# Patient Record
Sex: Female | Born: 1935
Health system: Southern US, Community
[De-identification: ages and names within clinical notes are randomized; demographics above are authoritative.]

## PROBLEM LIST (undated history)

## (undated) DIAGNOSIS — E7800 Pure hypercholesterolemia, unspecified: Secondary | ICD-10-CM

## (undated) DIAGNOSIS — R0600 Dyspnea, unspecified: Secondary | ICD-10-CM

## (undated) DIAGNOSIS — N183 Chronic kidney disease, stage 3 unspecified: Secondary | ICD-10-CM

## (undated) DIAGNOSIS — M1990 Unspecified osteoarthritis, unspecified site: Secondary | ICD-10-CM

## (undated) DIAGNOSIS — I350 Nonrheumatic aortic (valve) stenosis: Secondary | ICD-10-CM

## (undated) DIAGNOSIS — I050 Rheumatic mitral stenosis: Secondary | ICD-10-CM

## (undated) DIAGNOSIS — R7303 Prediabetes: Secondary | ICD-10-CM

## (undated) DIAGNOSIS — C20 Malignant neoplasm of rectum: Secondary | ICD-10-CM

## (undated) HISTORY — PX: BREAST BIOPSY: SHX20

## (undated) HISTORY — PX: COLON SURGERY: SHX602

## (undated) HISTORY — PX: BLADDER SURGERY: SHX569

## (undated) HISTORY — PX: APPENDECTOMY: SHX54

## (undated) HISTORY — PX: CHOLECYSTECTOMY: SHX55

## (undated) HISTORY — DX: Malignant neoplasm of rectum: C20

## (undated) HISTORY — PX: EYE SURGERY: SHX253

## (undated) HISTORY — PX: TONSILLECTOMY: SUR1361

## (undated) HISTORY — DX: Pure hypercholesterolemia, unspecified: E78.00

---

## 2002-12-24 ENCOUNTER — Encounter: Admission: RE | Admit: 2002-12-24 | Discharge: 2002-12-24 | Payer: Self-pay | Admitting: Family Medicine

## 2004-01-07 ENCOUNTER — Encounter: Admission: RE | Admit: 2004-01-07 | Discharge: 2004-01-07 | Payer: Self-pay

## 2005-03-09 ENCOUNTER — Encounter: Admission: RE | Admit: 2005-03-09 | Discharge: 2005-03-09 | Payer: Self-pay

## 2006-03-25 ENCOUNTER — Encounter: Admission: RE | Admit: 2006-03-25 | Discharge: 2006-03-25 | Payer: Self-pay

## 2007-05-09 ENCOUNTER — Encounter: Admission: RE | Admit: 2007-05-09 | Discharge: 2007-05-09 | Payer: Self-pay

## 2010-09-26 DIAGNOSIS — D72819 Decreased white blood cell count, unspecified: Secondary | ICD-10-CM

## 2010-09-26 DIAGNOSIS — N189 Chronic kidney disease, unspecified: Secondary | ICD-10-CM

## 2015-04-08 DIAGNOSIS — E7800 Pure hypercholesterolemia, unspecified: Secondary | ICD-10-CM

## 2015-04-08 HISTORY — DX: Pure hypercholesterolemia, unspecified: E78.00

## 2015-07-23 DIAGNOSIS — M189 Osteoarthritis of first carpometacarpal joint, unspecified: Secondary | ICD-10-CM | POA: Diagnosis not present

## 2015-07-23 DIAGNOSIS — G5602 Carpal tunnel syndrome, left upper limb: Secondary | ICD-10-CM | POA: Diagnosis not present

## 2015-07-23 DIAGNOSIS — M6530 Trigger finger, unspecified finger: Secondary | ICD-10-CM | POA: Diagnosis not present

## 2015-07-30 DIAGNOSIS — G5602 Carpal tunnel syndrome, left upper limb: Secondary | ICD-10-CM | POA: Diagnosis not present

## 2015-07-30 DIAGNOSIS — I129 Hypertensive chronic kidney disease with stage 1 through stage 4 chronic kidney disease, or unspecified chronic kidney disease: Secondary | ICD-10-CM | POA: Diagnosis not present

## 2015-07-30 DIAGNOSIS — N183 Chronic kidney disease, stage 3 (moderate): Secondary | ICD-10-CM | POA: Diagnosis not present

## 2015-07-30 DIAGNOSIS — Z01818 Encounter for other preprocedural examination: Secondary | ICD-10-CM | POA: Diagnosis not present

## 2015-07-30 DIAGNOSIS — K210 Gastro-esophageal reflux disease with esophagitis: Secondary | ICD-10-CM | POA: Diagnosis not present

## 2015-07-30 DIAGNOSIS — J45991 Cough variant asthma: Secondary | ICD-10-CM | POA: Diagnosis not present

## 2015-08-04 DIAGNOSIS — R000 Tachycardia, unspecified: Secondary | ICD-10-CM | POA: Diagnosis not present

## 2015-08-04 DIAGNOSIS — I129 Hypertensive chronic kidney disease with stage 1 through stage 4 chronic kidney disease, or unspecified chronic kidney disease: Secondary | ICD-10-CM | POA: Diagnosis not present

## 2015-08-04 DIAGNOSIS — I951 Orthostatic hypotension: Secondary | ICD-10-CM | POA: Diagnosis not present

## 2015-08-04 DIAGNOSIS — Z79899 Other long term (current) drug therapy: Secondary | ICD-10-CM | POA: Diagnosis not present

## 2015-08-04 DIAGNOSIS — N183 Chronic kidney disease, stage 3 (moderate): Secondary | ICD-10-CM | POA: Diagnosis not present

## 2015-08-08 DIAGNOSIS — N189 Chronic kidney disease, unspecified: Secondary | ICD-10-CM | POA: Diagnosis not present

## 2015-08-08 DIAGNOSIS — Z79899 Other long term (current) drug therapy: Secondary | ICD-10-CM | POA: Diagnosis not present

## 2015-08-08 DIAGNOSIS — M65312 Trigger thumb, left thumb: Secondary | ICD-10-CM | POA: Diagnosis not present

## 2015-08-08 DIAGNOSIS — G5602 Carpal tunnel syndrome, left upper limb: Secondary | ICD-10-CM | POA: Diagnosis not present

## 2015-08-08 DIAGNOSIS — I129 Hypertensive chronic kidney disease with stage 1 through stage 4 chronic kidney disease, or unspecified chronic kidney disease: Secondary | ICD-10-CM | POA: Diagnosis not present

## 2015-08-08 DIAGNOSIS — E7800 Pure hypercholesterolemia, unspecified: Secondary | ICD-10-CM | POA: Diagnosis not present

## 2015-08-08 DIAGNOSIS — K219 Gastro-esophageal reflux disease without esophagitis: Secondary | ICD-10-CM | POA: Diagnosis not present

## 2015-09-29 DIAGNOSIS — I129 Hypertensive chronic kidney disease with stage 1 through stage 4 chronic kidney disease, or unspecified chronic kidney disease: Secondary | ICD-10-CM | POA: Diagnosis not present

## 2015-09-29 DIAGNOSIS — E669 Obesity, unspecified: Secondary | ICD-10-CM | POA: Diagnosis not present

## 2015-09-29 DIAGNOSIS — Z79899 Other long term (current) drug therapy: Secondary | ICD-10-CM | POA: Diagnosis not present

## 2015-09-29 DIAGNOSIS — Z1389 Encounter for screening for other disorder: Secondary | ICD-10-CM | POA: Diagnosis not present

## 2015-09-29 DIAGNOSIS — R7303 Prediabetes: Secondary | ICD-10-CM | POA: Diagnosis not present

## 2015-09-29 DIAGNOSIS — E785 Hyperlipidemia, unspecified: Secondary | ICD-10-CM | POA: Diagnosis not present

## 2015-09-29 DIAGNOSIS — E559 Vitamin D deficiency, unspecified: Secondary | ICD-10-CM | POA: Diagnosis not present

## 2015-09-29 DIAGNOSIS — Z9181 History of falling: Secondary | ICD-10-CM | POA: Diagnosis not present

## 2015-10-13 DIAGNOSIS — Z136 Encounter for screening for cardiovascular disorders: Secondary | ICD-10-CM | POA: Diagnosis not present

## 2015-10-13 DIAGNOSIS — Z6831 Body mass index (BMI) 31.0-31.9, adult: Secondary | ICD-10-CM | POA: Diagnosis not present

## 2015-10-13 DIAGNOSIS — R300 Dysuria: Secondary | ICD-10-CM | POA: Diagnosis not present

## 2016-07-28 DIAGNOSIS — N390 Urinary tract infection, site not specified: Secondary | ICD-10-CM | POA: Diagnosis not present

## 2016-07-28 DIAGNOSIS — M79661 Pain in right lower leg: Secondary | ICD-10-CM | POA: Diagnosis not present

## 2016-07-28 DIAGNOSIS — M7989 Other specified soft tissue disorders: Secondary | ICD-10-CM | POA: Diagnosis not present

## 2016-08-18 DIAGNOSIS — K5730 Diverticulosis of large intestine without perforation or abscess without bleeding: Secondary | ICD-10-CM | POA: Diagnosis not present

## 2016-08-18 DIAGNOSIS — K921 Melena: Secondary | ICD-10-CM | POA: Diagnosis not present

## 2016-08-25 DIAGNOSIS — S86111D Strain of other muscle(s) and tendon(s) of posterior muscle group at lower leg level, right leg, subsequent encounter: Secondary | ICD-10-CM | POA: Diagnosis not present

## 2016-09-08 DIAGNOSIS — K5730 Diverticulosis of large intestine without perforation or abscess without bleeding: Secondary | ICD-10-CM | POA: Diagnosis not present

## 2016-09-22 DIAGNOSIS — N390 Urinary tract infection, site not specified: Secondary | ICD-10-CM | POA: Diagnosis not present

## 2016-09-22 DIAGNOSIS — R300 Dysuria: Secondary | ICD-10-CM | POA: Diagnosis not present

## 2016-10-13 DIAGNOSIS — Z79899 Other long term (current) drug therapy: Secondary | ICD-10-CM | POA: Diagnosis not present

## 2016-10-13 DIAGNOSIS — E785 Hyperlipidemia, unspecified: Secondary | ICD-10-CM | POA: Diagnosis not present

## 2016-10-13 DIAGNOSIS — K210 Gastro-esophageal reflux disease with esophagitis: Secondary | ICD-10-CM | POA: Diagnosis not present

## 2016-10-13 DIAGNOSIS — R7303 Prediabetes: Secondary | ICD-10-CM | POA: Diagnosis not present

## 2016-10-13 DIAGNOSIS — E559 Vitamin D deficiency, unspecified: Secondary | ICD-10-CM | POA: Diagnosis not present

## 2016-10-13 DIAGNOSIS — Z9181 History of falling: Secondary | ICD-10-CM | POA: Diagnosis not present

## 2016-10-13 DIAGNOSIS — Z1389 Encounter for screening for other disorder: Secondary | ICD-10-CM | POA: Diagnosis not present

## 2016-10-13 DIAGNOSIS — I129 Hypertensive chronic kidney disease with stage 1 through stage 4 chronic kidney disease, or unspecified chronic kidney disease: Secondary | ICD-10-CM | POA: Diagnosis not present

## 2016-10-13 DIAGNOSIS — Z0000 Encounter for general adult medical examination without abnormal findings: Secondary | ICD-10-CM | POA: Diagnosis not present

## 2016-10-15 DIAGNOSIS — R0602 Shortness of breath: Secondary | ICD-10-CM | POA: Diagnosis not present

## 2016-10-15 DIAGNOSIS — R531 Weakness: Secondary | ICD-10-CM | POA: Diagnosis not present

## 2016-10-15 DIAGNOSIS — R42 Dizziness and giddiness: Secondary | ICD-10-CM | POA: Diagnosis not present

## 2016-10-15 DIAGNOSIS — N289 Disorder of kidney and ureter, unspecified: Secondary | ICD-10-CM | POA: Diagnosis not present

## 2016-10-15 DIAGNOSIS — I10 Essential (primary) hypertension: Secondary | ICD-10-CM | POA: Diagnosis not present

## 2016-10-20 DIAGNOSIS — E785 Hyperlipidemia, unspecified: Secondary | ICD-10-CM | POA: Diagnosis not present

## 2016-10-20 DIAGNOSIS — N183 Chronic kidney disease, stage 3 (moderate): Secondary | ICD-10-CM | POA: Diagnosis not present

## 2016-10-20 DIAGNOSIS — R7303 Prediabetes: Secondary | ICD-10-CM | POA: Diagnosis not present

## 2016-10-20 DIAGNOSIS — I129 Hypertensive chronic kidney disease with stage 1 through stage 4 chronic kidney disease, or unspecified chronic kidney disease: Secondary | ICD-10-CM | POA: Diagnosis not present

## 2016-10-20 DIAGNOSIS — E559 Vitamin D deficiency, unspecified: Secondary | ICD-10-CM | POA: Diagnosis not present

## 2016-11-04 DIAGNOSIS — N390 Urinary tract infection, site not specified: Secondary | ICD-10-CM | POA: Diagnosis not present

## 2016-11-15 DIAGNOSIS — N390 Urinary tract infection, site not specified: Secondary | ICD-10-CM | POA: Diagnosis not present

## 2016-11-15 DIAGNOSIS — R300 Dysuria: Secondary | ICD-10-CM | POA: Diagnosis not present

## 2016-11-15 DIAGNOSIS — B9620 Unspecified Escherichia coli [E. coli] as the cause of diseases classified elsewhere: Secondary | ICD-10-CM | POA: Diagnosis not present

## 2016-11-22 DIAGNOSIS — B9620 Unspecified Escherichia coli [E. coli] as the cause of diseases classified elsewhere: Secondary | ICD-10-CM | POA: Diagnosis not present

## 2016-11-22 DIAGNOSIS — N390 Urinary tract infection, site not specified: Secondary | ICD-10-CM | POA: Diagnosis not present

## 2016-11-22 DIAGNOSIS — R05 Cough: Secondary | ICD-10-CM | POA: Diagnosis not present

## 2016-11-22 DIAGNOSIS — J22 Unspecified acute lower respiratory infection: Secondary | ICD-10-CM | POA: Diagnosis not present

## 2016-11-26 DIAGNOSIS — N390 Urinary tract infection, site not specified: Secondary | ICD-10-CM | POA: Diagnosis not present

## 2016-11-26 DIAGNOSIS — B9620 Unspecified Escherichia coli [E. coli] as the cause of diseases classified elsewhere: Secondary | ICD-10-CM | POA: Diagnosis not present

## 2016-11-26 DIAGNOSIS — R300 Dysuria: Secondary | ICD-10-CM | POA: Diagnosis not present

## 2016-11-26 DIAGNOSIS — J301 Allergic rhinitis due to pollen: Secondary | ICD-10-CM | POA: Diagnosis not present

## 2016-12-22 DIAGNOSIS — N3090 Cystitis, unspecified without hematuria: Secondary | ICD-10-CM | POA: Diagnosis not present

## 2016-12-22 DIAGNOSIS — R339 Retention of urine, unspecified: Secondary | ICD-10-CM | POA: Diagnosis not present

## 2017-01-03 DIAGNOSIS — K5730 Diverticulosis of large intestine without perforation or abscess without bleeding: Secondary | ICD-10-CM | POA: Diagnosis not present

## 2017-01-17 DIAGNOSIS — N390 Urinary tract infection, site not specified: Secondary | ICD-10-CM | POA: Diagnosis not present

## 2017-01-28 DIAGNOSIS — C20 Malignant neoplasm of rectum: Secondary | ICD-10-CM | POA: Diagnosis not present

## 2017-01-28 DIAGNOSIS — K5730 Diverticulosis of large intestine without perforation or abscess without bleeding: Secondary | ICD-10-CM | POA: Diagnosis not present

## 2017-01-28 DIAGNOSIS — Z79899 Other long term (current) drug therapy: Secondary | ICD-10-CM | POA: Diagnosis not present

## 2017-01-28 DIAGNOSIS — Z86010 Personal history of colonic polyps: Secondary | ICD-10-CM | POA: Diagnosis not present

## 2017-01-28 DIAGNOSIS — D126 Benign neoplasm of colon, unspecified: Secondary | ICD-10-CM | POA: Diagnosis not present

## 2017-01-28 DIAGNOSIS — N189 Chronic kidney disease, unspecified: Secondary | ICD-10-CM | POA: Diagnosis not present

## 2017-01-28 DIAGNOSIS — Z9049 Acquired absence of other specified parts of digestive tract: Secondary | ICD-10-CM | POA: Diagnosis not present

## 2017-01-28 DIAGNOSIS — J45909 Unspecified asthma, uncomplicated: Secondary | ICD-10-CM | POA: Diagnosis not present

## 2017-01-28 DIAGNOSIS — I129 Hypertensive chronic kidney disease with stage 1 through stage 4 chronic kidney disease, or unspecified chronic kidney disease: Secondary | ICD-10-CM | POA: Diagnosis not present

## 2017-01-28 DIAGNOSIS — K644 Residual hemorrhoidal skin tags: Secondary | ICD-10-CM | POA: Diagnosis not present

## 2017-02-03 DIAGNOSIS — N189 Chronic kidney disease, unspecified: Secondary | ICD-10-CM | POA: Diagnosis not present

## 2017-02-03 DIAGNOSIS — I10 Essential (primary) hypertension: Secondary | ICD-10-CM | POA: Diagnosis not present

## 2017-02-03 DIAGNOSIS — C20 Malignant neoplasm of rectum: Secondary | ICD-10-CM | POA: Diagnosis not present

## 2017-02-03 DIAGNOSIS — M1990 Unspecified osteoarthritis, unspecified site: Secondary | ICD-10-CM | POA: Diagnosis not present

## 2017-02-08 DIAGNOSIS — Z86010 Personal history of colonic polyps: Secondary | ICD-10-CM | POA: Diagnosis not present

## 2017-02-08 DIAGNOSIS — R911 Solitary pulmonary nodule: Secondary | ICD-10-CM | POA: Diagnosis not present

## 2017-02-08 DIAGNOSIS — C20 Malignant neoplasm of rectum: Secondary | ICD-10-CM | POA: Diagnosis not present

## 2017-02-08 DIAGNOSIS — N390 Urinary tract infection, site not specified: Secondary | ICD-10-CM | POA: Diagnosis not present

## 2017-02-10 DIAGNOSIS — R911 Solitary pulmonary nodule: Secondary | ICD-10-CM | POA: Diagnosis not present

## 2017-02-10 DIAGNOSIS — D3501 Benign neoplasm of right adrenal gland: Secondary | ICD-10-CM | POA: Diagnosis not present

## 2017-02-10 DIAGNOSIS — K449 Diaphragmatic hernia without obstruction or gangrene: Secondary | ICD-10-CM | POA: Diagnosis not present

## 2017-02-10 DIAGNOSIS — I2510 Atherosclerotic heart disease of native coronary artery without angina pectoris: Secondary | ICD-10-CM | POA: Diagnosis not present

## 2017-02-10 DIAGNOSIS — I700 Atherosclerosis of aorta: Secondary | ICD-10-CM | POA: Diagnosis not present

## 2017-02-10 DIAGNOSIS — C189 Malignant neoplasm of colon, unspecified: Secondary | ICD-10-CM | POA: Diagnosis not present

## 2017-02-10 DIAGNOSIS — C218 Malignant neoplasm of overlapping sites of rectum, anus and anal canal: Secondary | ICD-10-CM | POA: Diagnosis not present

## 2017-02-17 ENCOUNTER — Other Ambulatory Visit: Payer: Self-pay

## 2017-02-17 ENCOUNTER — Telehealth: Payer: Self-pay

## 2017-02-17 DIAGNOSIS — C20 Malignant neoplasm of rectum: Secondary | ICD-10-CM

## 2017-02-17 NOTE — Telephone Encounter (Signed)
EUS scheduled for 02/24/17

## 2017-02-18 NOTE — Telephone Encounter (Signed)
EUS scheduled, pt instructed and medications reviewed.  Patient instructions mailed to home.  Patient to call with any questions or concerns.  

## 2017-02-24 ENCOUNTER — Telehealth: Payer: Self-pay

## 2017-02-24 ENCOUNTER — Encounter (HOSPITAL_COMMUNITY)
Admission: RE | Disposition: A | Discharge status: Home / Self Care | Payer: Self-pay | Source: Ambulatory Visit | Attending: Gastroenterology

## 2017-02-24 ENCOUNTER — Ambulatory Visit (HOSPITAL_COMMUNITY)
Admission: RE | Admit: 2017-02-24 | Discharge: 2017-02-24 | Disposition: A | Discharge status: Home / Self Care | Payer: Medicare Other | Source: Ambulatory Visit | Attending: Gastroenterology | Admitting: Gastroenterology

## 2017-02-24 ENCOUNTER — Encounter (HOSPITAL_COMMUNITY): Payer: Self-pay | Admitting: Emergency Medicine

## 2017-02-24 DIAGNOSIS — K6289 Other specified diseases of anus and rectum: Secondary | ICD-10-CM | POA: Diagnosis not present

## 2017-02-24 DIAGNOSIS — C218 Malignant neoplasm of overlapping sites of rectum, anus and anal canal: Secondary | ICD-10-CM

## 2017-02-24 DIAGNOSIS — C20 Malignant neoplasm of rectum: Secondary | ICD-10-CM | POA: Diagnosis not present

## 2017-02-24 HISTORY — PX: EUS: SHX5427

## 2017-02-24 SURGERY — ULTRASOUND, LOWER GI TRACT, ENDOSCOPIC
Anesthesia: Moderate Sedation

## 2017-02-24 MED ORDER — FENTANYL CITRATE (PF) 100 MCG/2ML IJ SOLN
INTRAMUSCULAR | Status: DC | PRN
  Administered 2017-02-24 (×3): 12.5 ug via INTRAVENOUS

## 2017-02-24 MED ORDER — SODIUM CHLORIDE 0.9 % IV BOLUS (SEPSIS)
500.0000 mL | Freq: Once | INTRAVENOUS | Status: AC
  Administered 2017-02-24: 500 mL via INTRAVENOUS

## 2017-02-24 MED ORDER — MIDAZOLAM HCL 10 MG/2ML IJ SOLN
INTRAMUSCULAR | Status: DC | PRN
  Administered 2017-02-24: 2 mg via INTRAVENOUS
  Administered 2017-02-24 (×2): 1 mg via INTRAVENOUS

## 2017-02-24 MED ORDER — SODIUM CHLORIDE 0.9 % IV SOLN: INTRAVENOUS | Status: DC

## 2017-02-24 MED ORDER — FENTANYL CITRATE (PF) 100 MCG/2ML IJ SOLN
INTRAMUSCULAR | Status: AC
  Filled 2017-02-24: qty 2

## 2017-02-24 MED ORDER — MIDAZOLAM HCL 5 MG/ML IJ SOLN
INTRAMUSCULAR | Status: AC
  Filled 2017-02-24: qty 2

## 2017-02-24 NOTE — Telephone Encounter (Signed)
Report faxed as requested

## 2017-02-24 NOTE — H&P (Signed)
HPI: This is an 83 yo woman Referred by Dr. Hinton Rao from [facility]  Chief complaint is newly diagnosed rectal adenocarcinoma without evidence of metastatic disease on recent CT scan ches, abd, pelvis  ROS: complete GI ROS as described in HPI, all other review negative.  Constitutional:  No unintentional weight loss   History reviewed. No pertinent past medical history.  History reviewed. No pertinent surgical history.  Current Facility-Administered Medications  Medication Dose Route Frequency Provider Last Rate Last Dose  . 0.9 %  sodium chloride infusion   Intravenous Continuous Milus Banister, MD        Allergies as of 02/17/2017  . (Not on File)    History reviewed. No pertinent family history.  Social History   Social History  . Marital status: Widowed    Spouse name: N/A  . Number of children: N/A  . Years of education: N/A   Occupational History  . Not on file.   Social History Main Topics  . Smoking status: Not on file  . Smokeless tobacco: Not on file  . Alcohol use Not on file  . Drug use: Unknown  . Sexual activity: Not on file   Other Topics Concern  . Not on file   Social History Narrative  . No narrative on file     Physical Exam: BP (!) 177/62   Pulse 91   Temp (!) 97.2 F (36.2 C) (Oral)   Resp 17   Ht 4\' 11"  (1.499 m)   Wt 150 lb (68 kg)   SpO2 97%   BMI 30.30 kg/m  Constitutional: generally well-appearing Psychiatric: alert and oriented x3 Abdomen: soft, nontender, nondistended, no obvious ascites, no peritoneal signs, normal bowel sounds No peripheral edema noted in lower extremities  Assessment and plan: 83 y.o. female with recently diagnosed rectal adenocarcinoma (Colonoscoy Dr. Lyda Jester)  Her for TN staging with EUS  Please see the "Patient Instructions" section for addition details about the plan.  Owens Loffler, MD [facility] 02/24/2017, 2:50 PM

## 2017-02-24 NOTE — Discharge Instructions (Signed)

## 2017-02-24 NOTE — Op Note (Signed)
[facility] Patient Name: Hannah Briggs Procedure Date: 02/24/2017 MRN: [id number] Attending MD: Milus Banister , MD Date of Birth: 01/30/1932 CSN: [id number] Age: 83 Admit Type: Outpatient Procedure:                Lower EUS Indications:              Recently diagnosed rectal adenocarcinoma                            (colonoscopy Dr. Lyda Jester) without evident                            metastatic disease on chest/abd/pelvis CT scans. Providers:                Milus Banister, MD, Zenon Mayo, RN, Cletis Athens, Technician Referring MD:              Medicines:                Fentanyl 30 micrograms IV, Midazolam 4 mg IV Complications:            No immediate complications. Estimated blood loss:                            None. Estimated Blood Loss:     Estimated blood loss: none. Procedure:                Pre-Anesthesia Assessment:                           - Prior to the procedure, a History and Physical                            was performed, and patient medications and                            allergies were reviewed. The patient's tolerance of                            previous anesthesia was also reviewed. The risks                            and benefits of the procedure and the sedation                            options and risks were discussed with the patient.                            All questions were answered, and informed consent                            was obtained. Prior Anticoagulants: The patient has  taken no previous anticoagulant or antiplatelet                            agents. ASA Grade Assessment: II - A patient with                            mild systemic disease. After reviewing the risks                            and benefits, the patient was deemed in                            satisfactory condition to undergo the procedure.                           After obtaining informed  consent, the endoscope was                            passed under direct vision. Throughout the                            procedure, the patient's blood pressure, pulse, and                            oxygen saturations were monitored continuously. The                            [id number] ([id number]) scope was introduced through                            the anus and advanced to the the sigmoid colon for                            ultrasound. The lower EUS was accomplished without                            difficulty. The patient tolerated the procedure                            well. The quality of the bowel preparation was good. Scope In: Scope Out: Findings:      Sigmoidoscopic findings:      1. Clearly malignant 2.5cm, non-circumferential mass that lays on the       left side of the mid rectum with distal edge located 7-8cm from the anal       verge.      Endosonographic Finding :      1. The mass above correlated with a hypoechoic, heterogeneous lesion       that appears to involve and expand the muscularis proprial layer but       does not invade through that layer. uT2      2. No perirectal adenopathy. uN0 Impression:               - Non-circumferential 2.5cm uT2N0 (Stage I) rectal  adenocarcinoma that lays on the left lateral wall                            of the mid rectum with distal edge located 7-8cm                            from the anal verge. Moderate Sedation:      Moderate (conscious) sedation was administered by the endoscopy nurse       and supervised by the endoscopist. The following parameters were       monitored: oxygen saturation, heart rate, blood pressure, and response       to care. Total physician intraservice time was 19 minutes. Recommendation:           - Discharge patient to home (ambulatory).                           - Follow up with surgery, oncology in [place]. Procedure Code(s):        --- Professional ---                            [phone], Sigmoidoscopy, flexible; with endoscopic                            ultrasound examination                           99152, Moderate sedation services provided by the                            same physician or other qualified health care                            professional performing the diagnostic or                            therapeutic service that the sedation supports,                            requiring the presence of an independent trained                            observer to assist in the monitoring of the                            patient's level of consciousness and physiological                            status; initial 15 minutes of intraservice time,                            patient age 44 years or older Diagnosis Code(s):        --- Professional ---                           K62.89,  Other specified diseases of anus and rectum                           C21.8, Malignant neoplasm of overlapping sites of                            rectum, anus and anal canal CPT copyright 2016 American Medical Association. All rights reserved. The codes documented in this report are preliminary and upon coder review may  be revised to meet current compliance requirements. Milus Banister, MD 02/24/2017 3:38:35 PM This report has been signed electronically. Number of Addenda: 0

## 2017-02-24 NOTE — Telephone Encounter (Signed)
-----   Message from Milus Banister, MD sent at 02/24/2017  3:39 PM EDT ----- Can you send copies of todays EUS to Dr. Hinton Rao [facility]), Dr. Carmell Austria (surgeon in [place]), Dr. Lyda Jester (GI in [place]).   Thanks

## 2017-02-27 ENCOUNTER — Encounter (HOSPITAL_COMMUNITY): Payer: Self-pay | Admitting: Gastroenterology

## 2017-03-08 DIAGNOSIS — K219 Gastro-esophageal reflux disease without esophagitis: Secondary | ICD-10-CM | POA: Insufficient documentation

## 2017-03-08 DIAGNOSIS — N189 Chronic kidney disease, unspecified: Secondary | ICD-10-CM | POA: Insufficient documentation

## 2017-03-08 DIAGNOSIS — E785 Hyperlipidemia, unspecified: Secondary | ICD-10-CM

## 2017-03-08 DIAGNOSIS — E559 Vitamin D deficiency, unspecified: Secondary | ICD-10-CM | POA: Insufficient documentation

## 2017-03-08 DIAGNOSIS — R011 Cardiac murmur, unspecified: Secondary | ICD-10-CM | POA: Insufficient documentation

## 2017-03-08 DIAGNOSIS — C20 Malignant neoplasm of rectum: Secondary | ICD-10-CM | POA: Diagnosis not present

## 2017-03-08 DIAGNOSIS — I10 Essential (primary) hypertension: Secondary | ICD-10-CM | POA: Diagnosis not present

## 2017-03-08 DIAGNOSIS — M1990 Unspecified osteoarthritis, unspecified site: Secondary | ICD-10-CM | POA: Diagnosis not present

## 2017-03-08 DIAGNOSIS — I119 Hypertensive heart disease without heart failure: Secondary | ICD-10-CM

## 2017-03-08 DIAGNOSIS — R7303 Prediabetes: Secondary | ICD-10-CM

## 2017-03-08 HISTORY — DX: Prediabetes: R73.03

## 2017-03-08 HISTORY — DX: Hyperlipidemia, unspecified: E78.5

## 2017-03-08 HISTORY — DX: Vitamin D deficiency, unspecified: E55.9

## 2017-03-08 HISTORY — DX: Gastro-esophageal reflux disease without esophagitis: K21.9

## 2017-03-08 HISTORY — DX: Hypertensive heart disease without heart failure: I11.9

## 2017-03-09 DIAGNOSIS — I350 Nonrheumatic aortic (valve) stenosis: Secondary | ICD-10-CM | POA: Insufficient documentation

## 2017-03-10 ENCOUNTER — Encounter: Payer: Self-pay | Admitting: Cardiology

## 2017-03-10 ENCOUNTER — Ambulatory Visit (INDEPENDENT_AMBULATORY_CARE_PROVIDER_SITE_OTHER): Payer: Medicare Other | Admitting: Cardiology

## 2017-03-10 VITALS — BP 144/70 | HR 81 | Ht 59.0 in | Wt 155.0 lb

## 2017-03-10 DIAGNOSIS — I350 Nonrheumatic aortic (valve) stenosis: Secondary | ICD-10-CM | POA: Diagnosis not present

## 2017-03-10 DIAGNOSIS — I10 Essential (primary) hypertension: Secondary | ICD-10-CM | POA: Diagnosis not present

## 2017-03-10 DIAGNOSIS — Z01810 Encounter for preprocedural cardiovascular examination: Secondary | ICD-10-CM | POA: Diagnosis not present

## 2017-03-10 NOTE — Progress Notes (Signed)
Cardiology Office Note:    Date:  03/10/2017   ID:  Hannah Briggs, DOB 1932-05-30, MRN [id number]  PCP:  Melony Overly, MD  Cardiologist:  Shirlee More, MD    Referring MD: Melony Overly, MD    ASSESSMENT:    1. Preoperative cardiovascular examination   2. Nonrheumatic aortic valve stenosis   3. Essential hypertension    PLAN:    Preoperative cardiovascular evaluation  Surgeon: Dr Dory Larsen Procedure: Low resection for colorectal cancer The surgery is urgent. Active cardiac problems  Aortic stenosis. The cardiac status is stable, asymptomatic. The planned procedure is intermediate risk. The cardiac risk factors are hypertension and aortic stenosis The functional capacity is 4 mets or greater,   Yes, SHE DOES HEAVY HOUSE AND GARDEN WORK Recent cardiac tests performed EKG today is stable Given the above his overall risk for the planned procedure is acceptable if her Echo tomorrow 8AM is stable Antiplatelet/ anticoagulant recommendation: stop aspirin  Other cardiac medication or device recommendation: continue usual BP meds Anesthesia recommendation: Avoid hypotension with moderate AS on exam Observation, monitoring,and postoperative test recommendation: Telemtry for 24 hrs after surgery, EKG PO day 1  In order of problems listed above:  1. I feel that she is a good candidate for planned surgical procedure except we need to check an echocardiogram to look at the severity of her aortic stenosis and help guide anesthesia and perioperative care. He is scheduled for 8 AM tomorrow morning I'll call Dr. Pauletta Browns told the patient that by physical examination I expect her to proceed with surgery Monday. 2. For echocardiogram 3. Stable hypertension continue current treatment perioperatively along with lipid lowering medications   Next appointment: 6 months   Medication Adjustments/Labs and Tests Ordered: Current medicines are reviewed at length with the patient today.  Concerns  regarding medicines are outlined above.  Orders Placed This Encounter  Procedures  . EKG 12-Lead  . ECHOCARDIOGRAM COMPLETE   No orders of the defined types were placed in this encounter.   Chief Complaint  Patient presents with  . Pre-op Exam    per Dr Pauletta Browns prior to transanal resection  . Pre-op Exam  . Aortic Stenosis    History of Present Illness:    Hannah Briggs is a 83 y.o. female with a hx of hypertension, hyperlipidemia and aortic stenosis who is pending surgery on Monday last seen in Septmeber 2016. At that time I was concerned re severity of AS and echo was done at [facility] and result is requested..She remains active > 4 Mets and has had no chest pain, SOB, palpitation or syncope. Compliance with diet, lifestyle and medications: yes Past Medical History:  Diagnosis Date  . Pure hypercholesterolemia 04/08/2015   Overview:  Intolerant of pravastatin and simvastatin  . Rectal cancer [facility])     Past Surgical History:  Procedure Laterality Date  . APPENDECTOMY    . BLADDER SURGERY    . BREAST BIOPSY    . CESAREAN SECTION    . CHOLECYSTECTOMY    . EUS N/A 02/24/2017   Procedure: LOWER ENDOSCOPIC ULTRASOUND (EUS);  Surgeon: Milus Banister, MD;  Location: [facility];  Service: Endoscopy;  Laterality: N/A;  . TONSILLECTOMY      Current Medications: Current Meds  Medication Sig  . amLODipine (NORVASC) 5 MG tablet Take 5 mg by mouth daily.  . benazepril (LOTENSIN) 40 MG tablet Take 40 mg by mouth daily.  . bethanechol (URECHOLINE) 10 MG tablet Take 10 mg  by mouth 2 (two) times daily.   . Calcium Carbonate-Vitamin D (CALCIUM 600+D PO) Take 1 tablet by mouth daily.  . cephALEXin (KEFLEX) 250 MG capsule Take 250 mg by mouth daily.  . cholecalciferol (VITAMIN D) 1000 units tablet Take 1,000 Units by mouth daily.  Marland Kitchen CINNAMON PO Take 1,000 mg by mouth at bedtime.  . Coenzyme Q10 (COQ10) 100 MG CAPS Take 100 mg by mouth at bedtime.  Marland Kitchen ezetimibe (ZETIA) 10 MG tablet  Take 10 mg by mouth every evening.  . famotidine (PEPCID) 10 MG tablet Take 10 mg by mouth daily as needed for heartburn or indigestion.  . hydrochlorothiazide (HYDRODIURIL) 12.5 MG tablet Take 12.5 mg by mouth daily.  . montelukast (SINGULAIR) 10 MG tablet Take 10 mg by mouth at bedtime.  . Multiple Vitamin (MULTIVITAMIN WITH MINERALS) TABS tablet Take 1 tablet by mouth daily.  . Phenazopyridine HCl (AZO URINARY PAIN RELIEF PO) Take 3 tablets by mouth 3 (three) times daily as needed (uti symptoms).  . Potassium 99 MG TABS Take 99 mg by mouth daily.  . pravastatin (PRAVACHOL) 20 MG tablet Take 20 mg by mouth every Monday, Wednesday, and Friday. At night  . Probiotic CAPS Take 1 capsule by mouth daily.  Marland Kitchen PROCTO-MED HC 2.5 % rectal cream Place 1 application rectally daily as needed for hemorrhoids.  . [DISCONTINUED] aspirin EC 81 MG tablet Take 81 mg by mouth daily.     Allergies:   Claritin-d 12 hour [loratadine-pseudoephedrine er]; Codeine; Lisinopril; and Statins   Social History   Social History  . Marital status: Widowed    Spouse name: N/A  . Number of children: N/A  . Years of education: N/A   Social History Main Topics  . Smoking status: Never Smoker  . Smokeless tobacco: Never Used  . Alcohol use No  . Drug use: No  . Sexual activity: Not Asked   Other Topics Concern  . None   Social History Narrative  . None     Family History: The patient's family history includes AAA (abdominal aortic aneurysm) in her brother; Congestive Heart Failure in her father; Heart attack in her mother; Heart disease in her sister; Parkinson's disease in her sister. ROS:   Please see the history of present illness.    All other systems reviewed and are negative.  EKGs/Labs/Other Studies Reviewed:    The following studies were reviewed today:  EKG:  EKG ordered today.  The ekg ordered today demonstrates [place] poor R wave progression  Recent Labs:CMP 03/08/17 normal No results found  for requested labs within last 8760 hours.  Recent Lipid Panel No results found for: CHOL, TRIG, HDL, CHOLHDL, VLDL, LDLCALC, LDLDIRECT  Physical Exam:    VS:  BP (!) 144/70 (BP Location: Right Arm, Patient Position: Sitting)   Pulse 81   Ht 4\' 11"  (1.499 m)   Wt 155 lb (70.3 kg)   SpO2 95%   BMI 31.31 kg/m     Wt Readings from Last 3 Encounters:  03/10/17 155 lb (70.3 kg)  02/24/17 150 lb (68 kg)     GEN:  Well nourished, well developed in no acute distress HEENT: Normal NECK: No JVD; No carotid bruits LYMPHATICS: No lymphadenopathy CARDIAC: RRR, S2 split, 3/6 SEM mid peak radiates to the base of her carotids RESPIRATORY:  Clear to auscultation without rales, wheezing or rhonchi  ABDOMEN: Soft, non-tender, non-distended MUSCULOSKELETAL:  No edema; No deformity  SKIN: Warm and dry NEUROLOGIC:  Alert and oriented x  3 PSYCHIATRIC:  Normal affect    Signed, Shirlee More, MD  03/10/2017 12:11 PM    [facility]

## 2017-03-10 NOTE — Patient Instructions (Addendum)
Medication Instructions:  Your physician has recommended you make the following change in your medication:  STOP: ASPIRIN  Labwork: NONE  Testing/Procedures: Your physician has requested that you have an echocardiogram. Echocardiography is a painless test that uses sound waves to create images of your heart. It provides your doctor with information about the size and shape of your heart and how well your heart's chambers and valves are working. This procedure takes approximately one hour. There are no restrictions for this procedure.    Follow-Up: Your physician wants you to follow-up in: 6 months You will receive a reminder letter in the mail two months in advance. If you don't receive a letter, please call our office to schedule the follow-up appointment.  Any Other Special Instructions Will Be Listed Below (If Applicable).     If you need a refill on your cardiac medications before your next appointment, please call your pharmacy.

## 2017-03-11 DIAGNOSIS — Z01818 Encounter for other preprocedural examination: Secondary | ICD-10-CM | POA: Diagnosis not present

## 2017-03-11 DIAGNOSIS — I081 Rheumatic disorders of both mitral and tricuspid valves: Secondary | ICD-10-CM | POA: Diagnosis not present

## 2017-03-11 DIAGNOSIS — Z01810 Encounter for preprocedural cardiovascular examination: Secondary | ICD-10-CM | POA: Diagnosis not present

## 2017-03-11 DIAGNOSIS — I350 Nonrheumatic aortic (valve) stenosis: Secondary | ICD-10-CM | POA: Diagnosis not present

## 2017-03-11 DIAGNOSIS — C20 Malignant neoplasm of rectum: Secondary | ICD-10-CM

## 2017-03-11 HISTORY — DX: Malignant neoplasm of rectum: C20

## 2017-03-14 DIAGNOSIS — K219 Gastro-esophageal reflux disease without esophagitis: Secondary | ICD-10-CM | POA: Diagnosis not present

## 2017-03-14 DIAGNOSIS — K635 Polyp of colon: Secondary | ICD-10-CM | POA: Diagnosis not present

## 2017-03-14 DIAGNOSIS — I129 Hypertensive chronic kidney disease with stage 1 through stage 4 chronic kidney disease, or unspecified chronic kidney disease: Secondary | ICD-10-CM | POA: Diagnosis not present

## 2017-03-14 DIAGNOSIS — Z79899 Other long term (current) drug therapy: Secondary | ICD-10-CM | POA: Diagnosis not present

## 2017-03-14 DIAGNOSIS — M1990 Unspecified osteoarthritis, unspecified site: Secondary | ICD-10-CM | POA: Diagnosis not present

## 2017-03-14 DIAGNOSIS — N189 Chronic kidney disease, unspecified: Secondary | ICD-10-CM | POA: Diagnosis not present

## 2017-03-14 DIAGNOSIS — R1900 Intra-abdominal and pelvic swelling, mass and lump, unspecified site: Secondary | ICD-10-CM | POA: Diagnosis not present

## 2017-03-14 DIAGNOSIS — C20 Malignant neoplasm of rectum: Secondary | ICD-10-CM | POA: Diagnosis not present

## 2017-03-14 DIAGNOSIS — J45909 Unspecified asthma, uncomplicated: Secondary | ICD-10-CM | POA: Diagnosis not present

## 2017-03-14 DIAGNOSIS — K625 Hemorrhage of anus and rectum: Secondary | ICD-10-CM | POA: Diagnosis not present

## 2017-03-15 DIAGNOSIS — N189 Chronic kidney disease, unspecified: Secondary | ICD-10-CM | POA: Diagnosis not present

## 2017-03-15 DIAGNOSIS — C20 Malignant neoplasm of rectum: Secondary | ICD-10-CM | POA: Diagnosis not present

## 2017-03-15 DIAGNOSIS — M1990 Unspecified osteoarthritis, unspecified site: Secondary | ICD-10-CM | POA: Diagnosis not present

## 2017-03-15 DIAGNOSIS — J45909 Unspecified asthma, uncomplicated: Secondary | ICD-10-CM | POA: Diagnosis not present

## 2017-03-15 DIAGNOSIS — K635 Polyp of colon: Secondary | ICD-10-CM | POA: Diagnosis not present

## 2017-03-15 DIAGNOSIS — K625 Hemorrhage of anus and rectum: Secondary | ICD-10-CM | POA: Diagnosis not present

## 2017-03-15 DIAGNOSIS — K219 Gastro-esophageal reflux disease without esophagitis: Secondary | ICD-10-CM | POA: Diagnosis not present

## 2017-03-15 DIAGNOSIS — I129 Hypertensive chronic kidney disease with stage 1 through stage 4 chronic kidney disease, or unspecified chronic kidney disease: Secondary | ICD-10-CM | POA: Diagnosis not present

## 2017-03-15 DIAGNOSIS — R1900 Intra-abdominal and pelvic swelling, mass and lump, unspecified site: Secondary | ICD-10-CM | POA: Diagnosis not present

## 2017-03-15 DIAGNOSIS — Z79899 Other long term (current) drug therapy: Secondary | ICD-10-CM | POA: Diagnosis not present

## 2017-03-16 DIAGNOSIS — K625 Hemorrhage of anus and rectum: Secondary | ICD-10-CM | POA: Diagnosis not present

## 2017-03-16 DIAGNOSIS — K219 Gastro-esophageal reflux disease without esophagitis: Secondary | ICD-10-CM | POA: Diagnosis not present

## 2017-03-16 DIAGNOSIS — R1900 Intra-abdominal and pelvic swelling, mass and lump, unspecified site: Secondary | ICD-10-CM | POA: Diagnosis not present

## 2017-03-16 DIAGNOSIS — Z79899 Other long term (current) drug therapy: Secondary | ICD-10-CM | POA: Diagnosis not present

## 2017-03-16 DIAGNOSIS — I129 Hypertensive chronic kidney disease with stage 1 through stage 4 chronic kidney disease, or unspecified chronic kidney disease: Secondary | ICD-10-CM | POA: Diagnosis not present

## 2017-03-16 DIAGNOSIS — J45909 Unspecified asthma, uncomplicated: Secondary | ICD-10-CM | POA: Diagnosis not present

## 2017-03-16 DIAGNOSIS — C20 Malignant neoplasm of rectum: Secondary | ICD-10-CM | POA: Diagnosis not present

## 2017-03-16 DIAGNOSIS — K635 Polyp of colon: Secondary | ICD-10-CM | POA: Diagnosis not present

## 2017-03-16 DIAGNOSIS — N189 Chronic kidney disease, unspecified: Secondary | ICD-10-CM | POA: Diagnosis not present

## 2017-03-16 DIAGNOSIS — M1990 Unspecified osteoarthritis, unspecified site: Secondary | ICD-10-CM | POA: Diagnosis not present

## 2017-04-13 DIAGNOSIS — C20 Malignant neoplasm of rectum: Secondary | ICD-10-CM | POA: Diagnosis not present

## 2017-04-13 DIAGNOSIS — N390 Urinary tract infection, site not specified: Secondary | ICD-10-CM | POA: Diagnosis not present

## 2017-05-17 DIAGNOSIS — E559 Vitamin D deficiency, unspecified: Secondary | ICD-10-CM | POA: Diagnosis not present

## 2017-05-17 DIAGNOSIS — I129 Hypertensive chronic kidney disease with stage 1 through stage 4 chronic kidney disease, or unspecified chronic kidney disease: Secondary | ICD-10-CM | POA: Diagnosis not present

## 2017-05-17 DIAGNOSIS — E785 Hyperlipidemia, unspecified: Secondary | ICD-10-CM | POA: Diagnosis not present

## 2017-05-17 DIAGNOSIS — R7303 Prediabetes: Secondary | ICD-10-CM | POA: Diagnosis not present

## 2017-05-17 DIAGNOSIS — N183 Chronic kidney disease, stage 3 (moderate): Secondary | ICD-10-CM | POA: Diagnosis not present

## 2017-08-02 DIAGNOSIS — J329 Chronic sinusitis, unspecified: Secondary | ICD-10-CM | POA: Diagnosis not present

## 2017-08-02 DIAGNOSIS — Z85048 Personal history of other malignant neoplasm of rectum, rectosigmoid junction, and anus: Secondary | ICD-10-CM | POA: Diagnosis not present

## 2017-08-02 DIAGNOSIS — C20 Malignant neoplasm of rectum: Secondary | ICD-10-CM | POA: Diagnosis not present

## 2017-08-02 DIAGNOSIS — R05 Cough: Secondary | ICD-10-CM | POA: Diagnosis not present

## 2017-08-04 DIAGNOSIS — Z0000 Encounter for general adult medical examination without abnormal findings: Secondary | ICD-10-CM | POA: Diagnosis not present

## 2017-08-04 DIAGNOSIS — I129 Hypertensive chronic kidney disease with stage 1 through stage 4 chronic kidney disease, or unspecified chronic kidney disease: Secondary | ICD-10-CM | POA: Diagnosis not present

## 2017-08-04 DIAGNOSIS — R7303 Prediabetes: Secondary | ICD-10-CM | POA: Diagnosis not present

## 2017-08-04 DIAGNOSIS — Z1331 Encounter for screening for depression: Secondary | ICD-10-CM | POA: Diagnosis not present

## 2017-08-04 DIAGNOSIS — E785 Hyperlipidemia, unspecified: Secondary | ICD-10-CM | POA: Diagnosis not present

## 2017-08-04 DIAGNOSIS — Z136 Encounter for screening for cardiovascular disorders: Secondary | ICD-10-CM | POA: Diagnosis not present

## 2017-08-17 DIAGNOSIS — E559 Vitamin D deficiency, unspecified: Secondary | ICD-10-CM | POA: Diagnosis not present

## 2017-08-17 DIAGNOSIS — R7303 Prediabetes: Secondary | ICD-10-CM | POA: Diagnosis not present

## 2017-08-17 DIAGNOSIS — I129 Hypertensive chronic kidney disease with stage 1 through stage 4 chronic kidney disease, or unspecified chronic kidney disease: Secondary | ICD-10-CM | POA: Diagnosis not present

## 2017-08-17 DIAGNOSIS — E785 Hyperlipidemia, unspecified: Secondary | ICD-10-CM | POA: Diagnosis not present

## 2017-08-17 DIAGNOSIS — N183 Chronic kidney disease, stage 3 (moderate): Secondary | ICD-10-CM | POA: Diagnosis not present

## 2017-08-22 DIAGNOSIS — N959 Unspecified menopausal and perimenopausal disorder: Secondary | ICD-10-CM | POA: Diagnosis not present

## 2017-08-22 DIAGNOSIS — M8580 Other specified disorders of bone density and structure, unspecified site: Secondary | ICD-10-CM | POA: Diagnosis not present

## 2017-08-22 DIAGNOSIS — Z1231 Encounter for screening mammogram for malignant neoplasm of breast: Secondary | ICD-10-CM | POA: Diagnosis not present

## 2017-08-22 DIAGNOSIS — M85851 Other specified disorders of bone density and structure, right thigh: Secondary | ICD-10-CM | POA: Diagnosis not present

## 2017-09-22 DIAGNOSIS — E119 Type 2 diabetes mellitus without complications: Secondary | ICD-10-CM | POA: Diagnosis not present

## 2017-10-11 DIAGNOSIS — L570 Actinic keratosis: Secondary | ICD-10-CM | POA: Diagnosis not present

## 2017-11-16 DIAGNOSIS — Z85048 Personal history of other malignant neoplasm of rectum, rectosigmoid junction, and anus: Secondary | ICD-10-CM | POA: Diagnosis not present

## 2017-12-26 DIAGNOSIS — N3090 Cystitis, unspecified without hematuria: Secondary | ICD-10-CM | POA: Diagnosis not present

## 2017-12-26 DIAGNOSIS — R339 Retention of urine, unspecified: Secondary | ICD-10-CM | POA: Diagnosis not present

## 2018-02-08 DIAGNOSIS — K5730 Diverticulosis of large intestine without perforation or abscess without bleeding: Secondary | ICD-10-CM | POA: Diagnosis not present

## 2018-02-13 DIAGNOSIS — N390 Urinary tract infection, site not specified: Secondary | ICD-10-CM | POA: Diagnosis not present

## 2018-03-07 DIAGNOSIS — Z08 Encounter for follow-up examination after completed treatment for malignant neoplasm: Secondary | ICD-10-CM | POA: Diagnosis not present

## 2018-03-07 DIAGNOSIS — J45909 Unspecified asthma, uncomplicated: Secondary | ICD-10-CM | POA: Diagnosis not present

## 2018-03-07 DIAGNOSIS — Z85048 Personal history of other malignant neoplasm of rectum, rectosigmoid junction, and anus: Secondary | ICD-10-CM | POA: Diagnosis not present

## 2018-03-07 DIAGNOSIS — C20 Malignant neoplasm of rectum: Secondary | ICD-10-CM | POA: Diagnosis not present

## 2018-03-07 DIAGNOSIS — K648 Other hemorrhoids: Secondary | ICD-10-CM | POA: Diagnosis not present

## 2018-03-07 DIAGNOSIS — M1990 Unspecified osteoarthritis, unspecified site: Secondary | ICD-10-CM | POA: Diagnosis not present

## 2018-03-07 DIAGNOSIS — K5750 Diverticulosis of both small and large intestine without perforation or abscess without bleeding: Secondary | ICD-10-CM | POA: Diagnosis not present

## 2018-03-07 DIAGNOSIS — Z9049 Acquired absence of other specified parts of digestive tract: Secondary | ICD-10-CM | POA: Diagnosis not present

## 2018-03-07 DIAGNOSIS — K5730 Diverticulosis of large intestine without perforation or abscess without bleeding: Secondary | ICD-10-CM | POA: Diagnosis not present

## 2018-03-07 DIAGNOSIS — I10 Essential (primary) hypertension: Secondary | ICD-10-CM | POA: Diagnosis not present

## 2018-03-07 DIAGNOSIS — Z79899 Other long term (current) drug therapy: Secondary | ICD-10-CM | POA: Diagnosis not present

## 2018-03-07 DIAGNOSIS — Z7982 Long term (current) use of aspirin: Secondary | ICD-10-CM | POA: Diagnosis not present

## 2018-03-07 DIAGNOSIS — N289 Disorder of kidney and ureter, unspecified: Secondary | ICD-10-CM | POA: Diagnosis not present

## 2018-03-08 DIAGNOSIS — N390 Urinary tract infection, site not specified: Secondary | ICD-10-CM | POA: Diagnosis not present

## 2018-03-13 DIAGNOSIS — Z85048 Personal history of other malignant neoplasm of rectum, rectosigmoid junction, and anus: Secondary | ICD-10-CM | POA: Diagnosis not present

## 2018-03-22 ENCOUNTER — Encounter: Payer: Self-pay | Admitting: Cardiology

## 2018-03-22 DIAGNOSIS — R7303 Prediabetes: Secondary | ICD-10-CM | POA: Diagnosis not present

## 2018-03-22 DIAGNOSIS — R05 Cough: Secondary | ICD-10-CM | POA: Diagnosis not present

## 2018-03-22 DIAGNOSIS — I129 Hypertensive chronic kidney disease with stage 1 through stage 4 chronic kidney disease, or unspecified chronic kidney disease: Secondary | ICD-10-CM | POA: Diagnosis not present

## 2018-03-22 DIAGNOSIS — E785 Hyperlipidemia, unspecified: Secondary | ICD-10-CM | POA: Diagnosis not present

## 2018-03-22 DIAGNOSIS — K219 Gastro-esophageal reflux disease without esophagitis: Secondary | ICD-10-CM | POA: Diagnosis not present

## 2018-03-22 DIAGNOSIS — E559 Vitamin D deficiency, unspecified: Secondary | ICD-10-CM | POA: Diagnosis not present

## 2018-03-22 DIAGNOSIS — R042 Hemoptysis: Secondary | ICD-10-CM | POA: Diagnosis not present

## 2018-03-22 DIAGNOSIS — N3090 Cystitis, unspecified without hematuria: Secondary | ICD-10-CM | POA: Diagnosis not present

## 2018-04-04 ENCOUNTER — Ambulatory Visit (HOSPITAL_BASED_OUTPATIENT_CLINIC_OR_DEPARTMENT_OTHER)
Admission: RE | Admit: 2018-04-04 | Discharge: 2018-04-04 | Disposition: A | Discharge status: Home / Self Care | Payer: Medicare Other | Source: Ambulatory Visit | Attending: Cardiology | Admitting: Cardiology

## 2018-04-04 ENCOUNTER — Ambulatory Visit: Payer: Medicare Other | Admitting: Cardiology

## 2018-04-04 VITALS — BP 144/78 | HR 98 | Ht 59.0 in | Wt 153.0 lb

## 2018-04-04 DIAGNOSIS — I700 Atherosclerosis of aorta: Secondary | ICD-10-CM | POA: Insufficient documentation

## 2018-04-04 DIAGNOSIS — K449 Diaphragmatic hernia without obstruction or gangrene: Secondary | ICD-10-CM | POA: Diagnosis not present

## 2018-04-04 DIAGNOSIS — R042 Hemoptysis: Secondary | ICD-10-CM

## 2018-04-04 DIAGNOSIS — E785 Hyperlipidemia, unspecified: Secondary | ICD-10-CM | POA: Diagnosis not present

## 2018-04-04 DIAGNOSIS — I350 Nonrheumatic aortic (valve) stenosis: Secondary | ICD-10-CM

## 2018-04-04 DIAGNOSIS — I10 Essential (primary) hypertension: Secondary | ICD-10-CM

## 2018-04-04 DIAGNOSIS — R918 Other nonspecific abnormal finding of lung field: Secondary | ICD-10-CM | POA: Diagnosis not present

## 2018-04-04 DIAGNOSIS — I2510 Atherosclerotic heart disease of native coronary artery without angina pectoris: Secondary | ICD-10-CM | POA: Diagnosis not present

## 2018-04-04 HISTORY — DX: Hemoptysis: R04.2

## 2018-04-04 NOTE — Patient Instructions (Signed)
Medication Instructions:  Your physician has recommended you make the following change in your medication:   STOP aspirin  Labwork: None  Testing/Procedures: You had an EKG today.   Non-Cardiac CT scanning, (CAT scanning), is a noninvasive, special x-ray that produces cross-sectional images of the body using x-rays and a computer. CT scans help physicians diagnose and treat medical conditions. For some CT exams, a contrast material is used to enhance visibility in the area of the body being studied. CT scans provide greater clarity and reveal more details than regular x-ray exams.  Follow-Up: Your physician wants you to follow-up in: 6 months You will receive a reminder letter in the mail two months in advance. If you don't receive a letter, please call our office to schedule the follow-up appointment.   If you need a refill on your cardiac medications before your next appointment, please call your pharmacy.   Thank you for choosing [facility]! Robyne Peers, RN [phone]   CT Scan A CT scan (computed tomography scan) is an imaging scan. It uses X-rays and a computer to make detailed pictures of different areas inside the body. A CT scan can give more information than a regular X-ray exam. A CT scan provides data about internal organs, soft tissue structures, blood vessels, and bones. In this procedure, the pictures will be taken in a large machine that has an opening (CT scanner). Tell a health care provider about:  Any allergies you have.  All medicines you are taking, including vitamins, herbs, eye drops, creams, and over-the-counter medicines.  Any blood disorders you have.  Any surgeries you have had.  Any medical conditions you have.  Whether you are pregnant or may be pregnant. What are the risks? Generally, this is a safe procedure. However, problems may occur, including:  An allergic reaction to dyes.  Development of cancer from excessive exposure to  radiation from multiple CT scans. This is rare.  What happens before the procedure? Staying hydrated Follow instructions from your health care provider about hydration, which may include:  Up to 2 hours before the procedure - you may continue to drink clear liquids, such as water, clear fruit juice, black coffee, and plain tea.  Eating and drinking restrictions Follow instructions from your health care provider about eating and drinking, which may include:  24 hours before the procedure - stop drinking caffeinated beverages, such as energy drinks, tea, soda, coffee, and hot chocolate.  8 hours before the procedure - stop eating heavy meals or foods such as meat, fried foods, or fatty foods.  6 hours before the procedure - stop eating light meals or foods, such as toast or cereal.  6 hours before the procedure - stop drinking milk or drinks that contain milk.  2 hours before the procedure - stop drinking clear liquids.  General instructions  Remove any jewelry.  Ask your health care provider about changing or stopping your regular medicines. This is especially important if you are taking diabetes medicines or blood thinners. What happens during the procedure?  You will lie on a table with your arms above your head.  An IV tube may be inserted into one of your veins.  The contrast dye may be injected into the IV tube. You may feel warm or have a metallic taste in your mouth.  The table you will be lying on will move into the CT scanner.  You will be able to see, hear, and talk to the person running the machine while  you are in it. Follow that person's instructions.  The CT scanner will move around you to take pictures. Do not move while it is scanning. Staying still helps the scanner to get a good image.  When the best possible pictures have been taken, the machine will be turned off. The table will be moved out of the machine.  The IV tube will be removed. The procedure may  vary among health care providers and hospitals. What happens after the procedure?  It is up to you to get the results of your procedure. Ask your health care provider, or the department that is doing the procedure, when your results will be ready. Summary  A CT scan is an imaging scan.  A CT scan uses X-rays and a computer to make detailed pictures of different areas of your body.  Follow instructions from your health care provider about eating and drinking before the procedure.  You will be able to see, hear, and talk to the person running the machine while you are in it. Follow that person's instructions. This information is not intended to replace advice given to you by your health care provider. Make sure you discuss any questions you have with your health care provider. Document Released: 08/12/2004 Document Revised: 08/07/2016 Document Reviewed: 08/07/2016 Elsevier Interactive Patient Education  2018 [employer].

## 2018-04-04 NOTE — Progress Notes (Signed)
Cardiology Office Note:    Date:  04/04/2018   ID:  Hannah Briggs, DOB 02-May-1932, MRN [id number]  PCP:  Melony Overly, MD  Cardiologist:  Shirlee More, MD    Referring MD: Melony Overly, MD    ASSESSMENT:    1. Hemoptysis   2. Nonrheumatic aortic valve stenosis   3. Essential hypertension   4. Dyslipidemia    PLAN:    In order of problems listed above:  1. She has had a recent hemoptysis I suspect this is aspirin related but I asked her to have a CT scan of the chest performed and to discontinue aspirin. 2. Stable asymptomatic we will plan a follow-up echocardiogram in 1 year 3. Stable blood pressure target continue current treatment with thiazide diuretic 4. Stable continue her statin she takes a low intensity statin several days a week for class effect.  She also takes Zetia.  Due to her lipid profile I would not intensify therapy   Next appointment: 6 months   Medication Adjustments/Labs and Tests Ordered: Current medicines are reviewed at length with the patient today.  Concerns regarding medicines are outlined above.  Orders Placed This Encounter  Procedures  . CT Chest Wo Contrast  . EKG 12-Lead   No orders of the defined types were placed in this encounter.   Chief Complaint  Patient presents with  . Aortic Stenosis  . Hypertension  . Hyperlipidemia  . Chronic Kidney Disease    History of Present Illness:    Hannah Briggs is a 84 y.o. female with a hx of mild AS P/M 21/14 mm Hg, hypertension, CKD  and hyperlipidemia last seen 03/10/17. She had surgery for rectal cancer 03/14/17. She has had hemoptysis Compliance with diet, lifestyle and medications: Yes She is recovered fully from her colorectal surgery with the exception of loose stool.  She lives independently no chest pain shortness of breath palpitation or syncope recently she has had several episodes of dark hemoptysis had a normal chest x-ray she takes low-dose aspirin and for completeness I  will ask her to have a CT scan of her chest.  Her aortic stenosis was quite mild last year and I think we can safely wait 1 year before requiring a follow-up echocardiogram Past Medical History:  Diagnosis Date  . Pure hypercholesterolemia 04/08/2015   Overview:  Intolerant of pravastatin and simvastatin  . Rectal cancer [facility])     Past Surgical History:  Procedure Laterality Date  . APPENDECTOMY    . BLADDER SURGERY    . BREAST BIOPSY    . CESAREAN SECTION    . CHOLECYSTECTOMY    . EUS N/A 02/24/2017   Procedure: LOWER ENDOSCOPIC ULTRASOUND (EUS);  Surgeon: Milus Banister, MD;  Location: [facility];  Service: Endoscopy;  Laterality: N/A;  . TONSILLECTOMY      Current Medications: Current Meds  Medication Sig  . amLODipine (NORVASC) 5 MG tablet Take 5 mg by mouth daily.  . benazepril (LOTENSIN) 40 MG tablet Take 40 mg by mouth daily.  . Calcium Carbonate-Vitamin D (CALCIUM 600+D PO) Take 1 tablet by mouth daily.  . cholecalciferol (VITAMIN D) 1000 units tablet Take 1,000 Units by mouth daily.  . Coenzyme Q10 (COQ10) 100 MG CAPS Take 100 mg by mouth at bedtime.  Marland Kitchen ezetimibe (ZETIA) 10 MG tablet Take 10 mg by mouth every evening.  . famotidine (PEPCID) 10 MG tablet Take 10 mg by mouth 2 (two) times daily.   . hydrochlorothiazide (HYDRODIURIL)  12.5 MG tablet Take 12.5 mg by mouth daily.  . Multiple Vitamin (MULTIVITAMIN WITH MINERALS) TABS tablet Take 1 tablet by mouth daily.  . Phenazopyridine HCl (AZO URINARY PAIN RELIEF PO) Take 3 tablets by mouth 3 (three) times daily as needed (uti symptoms).  . Potassium 99 MG TABS Take 99 mg by mouth daily.  . pravastatin (PRAVACHOL) 20 MG tablet Take 40 mg by mouth every Monday, Wednesday, and Friday. At night   . Probiotic CAPS Take 1 capsule by mouth daily.  . Wheat Dextrin (BENEFIBER DRINK MIX) PACK Take 1 Package by mouth every other day.  . [DISCONTINUED] aspirin 81 MG EC tablet Take 81 mg by mouth daily. Swallow whole.      Allergies:   Claritin-d 12 hour [loratadine-pseudoephedrine er]; Codeine; Lisinopril; and Statins   Social History   Socioeconomic History  . Marital status: Widowed    Spouse name: Not on file  . Number of children: Not on file  . Years of education: Not on file  . Highest education level: Not on file  Occupational History  . Not on file  Social Needs  . Financial resource strain: Not on file  . Food insecurity:    Worry: Not on file    Inability: Not on file  . Transportation needs:    Medical: Not on file    Non-medical: Not on file  Tobacco Use  . Smoking status: Never Smoker  . Smokeless tobacco: Never Used  Substance and Sexual Activity  . Alcohol use: No  . Drug use: No  . Sexual activity: Not on file  Lifestyle  . Physical activity:    Days per week: Not on file    Minutes per session: Not on file  . Stress: Not on file  Relationships  . Social connections:    Talks on phone: Not on file    Gets together: Not on file    Attends religious service: Not on file    Active member of club or organization: Not on file    Attends meetings of clubs or organizations: Not on file    Relationship status: Not on file  Other Topics Concern  . Not on file  Social History Narrative  . Not on file     Family History: The patient's family history includes AAA (abdominal aortic aneurysm) in her brother; Congestive Heart Failure in her father; Heart attack in her mother; Heart disease in her sister; Parkinson's disease in her sister. ROS:   Please see the history of present illness.    All other systems reviewed and are negative.  EKGs/Labs/Other Studies Reviewed:    The following studies were reviewed today:  EKG:  EKG ordered today.  The ekg ordered today demonstrates sinus rhythm left atrial enlargement otherwise normal  Recent Labs:   03/22/18 Hgb 13.7, Cr 1.0 No results found for requested labs within last 8760 hours.  Recent Lipid Panel 03/22/2018 HDL 58 LDL  143 A1c 5.9 creatinine 1.0 No results found for: CHOL, TRIG, HDL, CHOLHDL, VLDL, LDLCALC, LDLDIRECT  Physical Exam:    VS:  BP (!) 144/78 (BP Location: Right Arm, Patient Position: Sitting, Cuff Size: Normal)   Pulse 98   Ht 4\' 11"  (1.499 m)   Wt 153 lb (69.4 kg)   SpO2 95%   BMI 30.90 kg/m     Wt Readings from Last 3 Encounters:  04/04/18 153 lb (69.4 kg)  03/10/17 155 lb (70.3 kg)  02/24/17 150 lb (68 kg)  GEN: There is a mild resting tremor well nourished, well developed in no acute distress HEENT: Normal NECK: No JVD; No carotid bruits LYMPHATICS: No lymphadenopathy CARDIAC: Grade 2/6 systolic ejection murmur aortic area rating to the right clavicle peak Cirelli S2 is intact no AR RRR, no rubs, gallops RESPIRATORY:  Clear to auscultation without rales, wheezing or rhonchi  ABDOMEN: Soft, non-tender, non-distended MUSCULOSKELETAL:  No edema; No deformity  SKIN: Warm and dry NEUROLOGIC:  Alert and oriented x 3 PSYCHIATRIC:  Normal affect    Signed, Shirlee More, MD  04/04/2018 1:09 PM    [place]

## 2018-04-05 DIAGNOSIS — Z9181 History of falling: Secondary | ICD-10-CM | POA: Diagnosis not present

## 2018-04-05 DIAGNOSIS — N959 Unspecified menopausal and perimenopausal disorder: Secondary | ICD-10-CM | POA: Diagnosis not present

## 2018-04-05 DIAGNOSIS — E785 Hyperlipidemia, unspecified: Secondary | ICD-10-CM | POA: Diagnosis not present

## 2018-04-05 DIAGNOSIS — Z1239 Encounter for other screening for malignant neoplasm of breast: Secondary | ICD-10-CM | POA: Diagnosis not present

## 2018-04-05 DIAGNOSIS — N390 Urinary tract infection, site not specified: Secondary | ICD-10-CM | POA: Diagnosis not present

## 2018-04-05 DIAGNOSIS — Z0000 Encounter for general adult medical examination without abnormal findings: Secondary | ICD-10-CM | POA: Diagnosis not present

## 2018-04-05 DIAGNOSIS — Z139 Encounter for screening, unspecified: Secondary | ICD-10-CM | POA: Diagnosis not present

## 2018-04-12 DIAGNOSIS — C20 Malignant neoplasm of rectum: Secondary | ICD-10-CM | POA: Diagnosis not present

## 2018-04-21 DIAGNOSIS — K920 Hematemesis: Secondary | ICD-10-CM | POA: Diagnosis not present

## 2018-04-21 DIAGNOSIS — K449 Diaphragmatic hernia without obstruction or gangrene: Secondary | ICD-10-CM | POA: Diagnosis not present

## 2018-04-21 DIAGNOSIS — Z79899 Other long term (current) drug therapy: Secondary | ICD-10-CM | POA: Diagnosis not present

## 2018-04-21 DIAGNOSIS — K2210 Ulcer of esophagus without bleeding: Secondary | ICD-10-CM | POA: Diagnosis not present

## 2018-04-21 DIAGNOSIS — K210 Gastro-esophageal reflux disease with esophagitis: Secondary | ICD-10-CM | POA: Diagnosis not present

## 2018-04-21 DIAGNOSIS — K317 Polyp of stomach and duodenum: Secondary | ICD-10-CM | POA: Diagnosis not present

## 2018-04-21 DIAGNOSIS — E7800 Pure hypercholesterolemia, unspecified: Secondary | ICD-10-CM | POA: Diagnosis not present

## 2018-04-21 DIAGNOSIS — Z85048 Personal history of other malignant neoplasm of rectum, rectosigmoid junction, and anus: Secondary | ICD-10-CM | POA: Diagnosis not present

## 2018-04-21 DIAGNOSIS — I10 Essential (primary) hypertension: Secondary | ICD-10-CM | POA: Diagnosis not present

## 2018-04-21 DIAGNOSIS — K219 Gastro-esophageal reflux disease without esophagitis: Secondary | ICD-10-CM | POA: Diagnosis not present

## 2018-04-21 DIAGNOSIS — M1990 Unspecified osteoarthritis, unspecified site: Secondary | ICD-10-CM | POA: Diagnosis not present

## 2018-05-22 DIAGNOSIS — K210 Gastro-esophageal reflux disease with esophagitis: Secondary | ICD-10-CM | POA: Diagnosis not present

## 2018-05-26 ENCOUNTER — Other Ambulatory Visit: Payer: Self-pay

## 2018-05-26 NOTE — Patient Outreach (Signed)
[facility]) Care Management  05/26/2018  [name] 19-Dec-1931 [id number]   Medication Adherence call to Mrs. Hannah Briggs  spoke with patient she is due on Pravastatin 20 mg she is only taking it 3 times a week now because she was having side effects, doctor advice patient to start taking it this way now, patient has plenty and does not need any at this time. Mrs. Rosch is showing past due under [place].   [facility] Management Direct Dial [phone]  Fax [phone] [email or web address]

## 2018-06-26 DIAGNOSIS — N183 Chronic kidney disease, stage 3 (moderate): Secondary | ICD-10-CM | POA: Diagnosis not present

## 2018-06-26 DIAGNOSIS — I129 Hypertensive chronic kidney disease with stage 1 through stage 4 chronic kidney disease, or unspecified chronic kidney disease: Secondary | ICD-10-CM | POA: Diagnosis not present

## 2018-06-26 DIAGNOSIS — N3090 Cystitis, unspecified without hematuria: Secondary | ICD-10-CM | POA: Diagnosis not present

## 2018-06-26 DIAGNOSIS — M1990 Unspecified osteoarthritis, unspecified site: Secondary | ICD-10-CM | POA: Diagnosis not present

## 2018-06-27 DIAGNOSIS — N390 Urinary tract infection, site not specified: Secondary | ICD-10-CM | POA: Diagnosis not present

## 2018-06-27 DIAGNOSIS — R339 Retention of urine, unspecified: Secondary | ICD-10-CM | POA: Diagnosis not present

## 2018-06-28 DIAGNOSIS — Z85048 Personal history of other malignant neoplasm of rectum, rectosigmoid junction, and anus: Secondary | ICD-10-CM | POA: Diagnosis not present

## 2018-06-30 ENCOUNTER — Other Ambulatory Visit: Payer: Self-pay | Admitting: Nephrology

## 2018-06-30 DIAGNOSIS — N183 Chronic kidney disease, stage 3 unspecified: Secondary | ICD-10-CM

## 2018-08-15 DIAGNOSIS — R93421 Abnormal radiologic findings on diagnostic imaging of right kidney: Secondary | ICD-10-CM | POA: Diagnosis not present

## 2018-08-15 DIAGNOSIS — N183 Chronic kidney disease, stage 3 (moderate): Secondary | ICD-10-CM | POA: Diagnosis not present

## 2018-08-15 DIAGNOSIS — R93422 Abnormal radiologic findings on diagnostic imaging of left kidney: Secondary | ICD-10-CM | POA: Diagnosis not present

## 2018-08-15 DIAGNOSIS — N390 Urinary tract infection, site not specified: Secondary | ICD-10-CM | POA: Diagnosis not present

## 2018-09-20 DIAGNOSIS — L814 Other melanin hyperpigmentation: Secondary | ICD-10-CM | POA: Diagnosis not present

## 2018-09-21 DIAGNOSIS — D72829 Elevated white blood cell count, unspecified: Secondary | ICD-10-CM | POA: Diagnosis not present

## 2018-09-21 DIAGNOSIS — E559 Vitamin D deficiency, unspecified: Secondary | ICD-10-CM | POA: Diagnosis not present

## 2018-09-21 DIAGNOSIS — E785 Hyperlipidemia, unspecified: Secondary | ICD-10-CM | POA: Diagnosis not present

## 2018-09-21 DIAGNOSIS — K219 Gastro-esophageal reflux disease without esophagitis: Secondary | ICD-10-CM | POA: Diagnosis not present

## 2018-09-21 DIAGNOSIS — R7303 Prediabetes: Secondary | ICD-10-CM | POA: Diagnosis not present

## 2018-09-21 DIAGNOSIS — I129 Hypertensive chronic kidney disease with stage 1 through stage 4 chronic kidney disease, or unspecified chronic kidney disease: Secondary | ICD-10-CM | POA: Diagnosis not present

## 2018-10-13 ENCOUNTER — Ambulatory Visit: Payer: Medicare Other | Admitting: Cardiology

## 2018-11-10 ENCOUNTER — Other Ambulatory Visit: Payer: Self-pay

## 2018-11-10 ENCOUNTER — Telehealth: Payer: Self-pay | Admitting: Cardiology

## 2018-11-10 ENCOUNTER — Telehealth (INDEPENDENT_AMBULATORY_CARE_PROVIDER_SITE_OTHER): Payer: Medicare Other | Admitting: Cardiology

## 2018-11-10 ENCOUNTER — Encounter: Payer: Self-pay | Admitting: Cardiology

## 2018-11-10 VITALS — BP 133/76 | HR 82 | Ht 59.0 in | Wt 158.0 lb

## 2018-11-10 DIAGNOSIS — I119 Hypertensive heart disease without heart failure: Secondary | ICD-10-CM

## 2018-11-10 DIAGNOSIS — I350 Nonrheumatic aortic (valve) stenosis: Secondary | ICD-10-CM | POA: Diagnosis not present

## 2018-11-10 DIAGNOSIS — E785 Hyperlipidemia, unspecified: Secondary | ICD-10-CM

## 2018-11-10 NOTE — Progress Notes (Signed)
Virtual Visit via Telephone Note   This visit type was conducted due to national recommendations for restrictions regarding the COVID-19 Pandemic (e.g. social distancing) in an effort to limit this patient's exposure and mitigate transmission in our community.  Due to her co-morbid illnesses, this patient is at least at moderate risk for complications without adequate follow up.  This format is felt to be most appropriate for this patient at this time.  The patient did not have access to video technology/had technical difficulties with video requiring transitioning to audio format only (telephone).  All issues noted in this document were discussed and addressed.  No physical exam could be performed with this format.  Please refer to the patient's chart for her  consent to telehealth for [facility].   Evaluation Performed:  Follow-up visit  Date:  11/10/2018   ID:  Hannah, Briggs 02-26-1932, MRN [id number]  Patient Location: Home Provider Location: Home  PCP:  Melony Overly, MD  Cardiologist:  No primary care provider on file. Dr Bettina Gavia Electrophysiologist:  None   Chief Complaint:  Aortic stenosis  History of Present Illness:    Hannah Briggs is a 85 y.o. female with a hx of mild AS with P/M 21/14 mm H in 2018, , hypertension, CKD  and hyperlipidemia last seen 04/04/18 . She had surgery for rectal cancer 03/14/17.      The patient does not have symptoms concerning for COVID-19 infection (fever, chills, cough, or new shortness of breath).   This was a difficult visit he began video we lost the link and I called back and we finished by phone.  She is feeling well her only complaint is heartburn she takes Prilosec and I asked her to start taking an antiacid before she goes to bed and joint pain.  She has had no shortness of breath syncope or chest pain.  She needs a follow-up echocardiogram and I will see her in my office in July and make arrangements.  Recent labs were reviewed  with her from her PCP office her kidney function is stable improved lipids are ideal blood count is normal.  She has had no fever chills or weight loss.  She has good healthcare literacy practices social distance wears a mask and good handwashing technique Past Medical History:  Diagnosis Date   Pure hypercholesterolemia 04/08/2015   Overview:  Intolerant of pravastatin and simvastatin   Rectal cancer ([place])    Past Surgical History:  Procedure Laterality Date   APPENDECTOMY     BLADDER SURGERY     BREAST BIOPSY     CESAREAN SECTION     CHOLECYSTECTOMY     EUS N/A 02/24/2017   Procedure: LOWER ENDOSCOPIC ULTRASOUND (EUS);  Surgeon: Milus Banister, MD;  Location: [facility];  Service: Endoscopy;  Laterality: N/A;   TONSILLECTOMY       No outpatient medications have been marked as taking for the 11/10/18 encounter (Appointment) with Richardo Priest, MD.     Allergies:   Claritin-d 12 hour [loratadine-pseudoephedrine er]; Codeine; Lisinopril; and Statins   Social History   Tobacco Use   Smoking status: Never Smoker   Smokeless tobacco: Never Used  Substance Use Topics   Alcohol use: No   Drug use: No     Family Hx: The patient's family history includes AAA (abdominal aortic aneurysm) in her brother; Congestive Heart Failure in her father; Heart attack in her mother; Heart disease in her sister; Parkinson's disease in her  sister.  ROS:   Please see the history of present illness.     All other systems reviewed and are negative.   Prior CV studies:   The following studies were reviewed today:    Labs/Other Tests and Data Reviewed:    EKG:  No ECG reviewed.  Recent Labs: from [facility]  09/21/2018 A1c 6.2% CMP is normal Normal  liver function tests creatinine 1.03 GFR 50 cc  potassium 4.1  cholesterol 201 HDL 60 LDL 93  hemoglobin 13.3 platelets 213,000  Wt Readings from Last 3 Encounters:  04/04/18 153 lb (69.4 kg)  03/10/17 155 lb (70.3 kg)  02/24/17  150 lb (68 kg)     Objective:    Vital Signs:  There were no vitals taken for this visit.   VITAL SIGNS:  reviewed Alert oriented 3 mood affect thought cognition normal no wheezes or respiratory distress  ASSESSMENT & PLAN:    1. Aortic stenosis asymptomatic she is hemodynamically mild 2 years ago it would be very unusual to progress quickly I suspect should be mild to moderate we will recheck echocardiogram this fall and unless she had severe and symptomatic aortic stenosis she would not need to consider intervention. 2. Hypertension stable well-controlled continue current treatment including diuretic ACE inhibitor long-acting calcium channel blocker 3. CKD stable recent labs actually improved 4. Hyperlipidemia stable lipids are ideal continue her current low intensity statin  COVID-19 Education: The signs and symptoms of COVID-19 were discussed with the patient and how to seek care for testing (follow up with PCP or arrange E-visit).  The importance of social distancing was discussed today.  Time:   Today, I have spent 25 minutes with the patient with telehealth technology discussing the above problems.     Medication Adjustments/Labs and Tests Ordered: Current medicines are reviewed at length with the patient today.  Concerns regarding medicines are outlined above.   Tests Ordered: No orders of the defined types were placed in this encounter.   Medication Changes: No orders of the defined types were placed in this encounter.   Disposition:  Follow up in 3 month(s)  Signed, Shirlee More, MD  11/10/2018 8:37 AM    [facility]

## 2018-11-10 NOTE — Telephone Encounter (Signed)
Virtual Visit Pre-Appointment Phone Call  "(Name), I am calling you today to discuss your upcoming appointment. We are currently trying to limit exposure to the virus that causes COVID-19 by seeing patients at home rather than in the office."  1. "What is the BEST phone number to call the day of the visit?" - include this in appointment notes  2. Do you have or have access to (through a family member/friend) a smartphone with video capability that we can use for your visit?" a. If yes - list this number in appt notes as cell (if different from BEST phone #) and list the appointment type as a VIDEO visit in appointment notes b. If no - list the appointment type as a PHONE visit in appointment notes  3. Confirm consent - "In the setting of the current Covid19 crisis, you are scheduled for a (phone or video) visit with your provider on (date) at (time).  Just as we do with many in-office visits, in order for you to participate in this visit, we must obtain consent.  If you'd like, I can send this to your mychart (if signed up) or email for you to review.  Otherwise, I can obtain your verbal consent now.  All virtual visits are billed to your insurance company just like a normal visit would be.  By agreeing to a virtual visit, we'd like you to understand that the technology does not allow for your provider to perform an examination, and thus may limit your provider's ability to fully assess your condition. If your provider identifies any concerns that need to be evaluated in person, we will make arrangements to do so.  Finally, though the technology is pretty good, we cannot assure that it will always work on either your or our end, and in the setting of a video visit, we may have to convert it to a phone-only visit.  In either situation, we cannot ensure that we have a secure connection.  Are you willing to proceed?" STAFF: Did the patient verbally acknowledge consent to telehealth visit? Document  YES/NO here: Yes  4. Advise patient to be prepared - "Two hours prior to your appointment, go ahead and check your blood pressure, pulse, oxygen saturation, and your weight (if you have the equipment to check those) and write them all down. When your visit starts, your provider will ask you for this information. If you have an Apple Watch or Kardia device, please plan to have heart rate information ready on the day of your appointment. Please have a pen and paper handy nearby the day of the visit as well."  5. Give patient instructions for MyChart download to smartphone OR [email or web address] as below if video visit (depending on what platform provider is using)  6. Inform patient they will receive a phone call 15 minutes prior to their appointment time (may be from unknown caller ID) so they should be prepared to answer    TELEPHONE CALL NOTE  CHEE DIMON has been deemed a candidate for a follow-up tele-health visit to limit community exposure during the Covid-19 pandemic. I spoke with the patient via phone to ensure availability of phone/video source, confirm preferred email & phone number, and discuss instructions and expectations.  I reminded RALYN STLAURENT to be prepared with any vital sign and/or heart rhythm information that could potentially be obtained via home monitoring, at the time of her visit. I reminded ANGELI DEMILIO to expect a phone call prior to  her visit.  Calla Kicks 11/10/2018 1:37 PM     FULL LENGTH CONSENT FOR TELE-HEALTH VISIT   I hereby voluntarily request, consent and authorize [facility] and its employed or contracted physicians, physician assistants, nurse practitioners or other licensed health care professionals (the Practitioner), to provide me with telemedicine health care services (the Services") as deemed necessary by the treating Practitioner. I acknowledge and consent to receive the Services by the Practitioner via telemedicine. I understand that the  telemedicine visit will involve communicating with the Practitioner through live audiovisual communication technology and the disclosure of certain medical information by electronic transmission. I acknowledge that I have been given the opportunity to request an in-person assessment or other available alternative prior to the telemedicine visit and am voluntarily participating in the telemedicine visit.  I understand that I have the right to withhold or withdraw my consent to the use of telemedicine in the course of my care at any time, without affecting my right to future care or treatment, and that the Practitioner or I may terminate the telemedicine visit at any time. I understand that I have the right to inspect all information obtained and/or recorded in the course of the telemedicine visit and may receive copies of available information for a reasonable fee.  I understand that some of the potential risks of receiving the Services via telemedicine include:   Delay or interruption in medical evaluation due to technological equipment failure or disruption;  Information transmitted may not be sufficient (e.g. poor resolution of images) to allow for appropriate medical decision making by the Practitioner; and/or   In rare instances, security protocols could fail, causing a breach of personal health information.  Furthermore, I acknowledge that it is my responsibility to provide information about my medical history, conditions and care that is complete and accurate to the best of my ability. I acknowledge that Practitioner's advice, recommendations, and/or decision may be based on factors not within their control, such as incomplete or inaccurate data provided by me or distortions of diagnostic images or specimens that may result from electronic transmissions. I understand that the practice of medicine is not an exact science and that Practitioner makes no warranties or guarantees regarding treatment  outcomes. I acknowledge that I will receive a copy of this consent concurrently upon execution via email to the email address I last provided but may also request a printed copy by calling the office of [place].    I understand that my insurance will be billed for this visit.   I have read or had this consent read to me.  I understand the contents of this consent, which adequately explains the benefits and risks of the Services being provided via telemedicine.   I have been provided ample opportunity to ask questions regarding this consent and the Services and have had my questions answered to my satisfaction.  I give my informed consent for the services to be provided through the use of telemedicine in my medical care  By participating in this telemedicine visit I agree to the above.

## 2018-11-10 NOTE — Patient Instructions (Addendum)
Medication Instructions:  Your physician recommends that you continue on your current medications as directed. Please refer to the Current Medication list given to you today.  If you need a refill on your cardiac medications before your next appointment, please call your pharmacy.   Lab work: None  If you have labs (blood work) drawn today and your tests are completely normal, you will receive your results only by: Marland Kitchen MyChart Message (if you have MyChart) OR . A paper copy in the mail If you have any lab test that is abnormal or we need to change your treatment, we will call you to review the results.  Testing/Procedures: None  Follow-Up: At [facility], you and your health needs are our priority.  As part of our continuing mission to provide you with exceptional heart care, we have created designated Provider Care Teams.  These Care Teams include your primary Cardiologist (physician) and Advanced Practice Providers (APPs -  Physician Assistants and Nurse Practitioners) who all work together to provide you with the care you need, when you need it. You will need a follow up virtual appointment in 3 months: Tuesday, 02/13/2019, at 11:00 am.

## 2018-11-17 ENCOUNTER — Telehealth: Payer: Self-pay | Admitting: *Deleted

## 2018-11-17 MED ORDER — FENOFIBRATE 145 MG PO TABS: 145.0000 mg | ORAL_TABLET | Freq: Every day | ORAL | Status: DC

## 2018-11-17 NOTE — Telephone Encounter (Signed)
Fenofibrate added.

## 2018-11-17 NOTE — Telephone Encounter (Signed)
Fenofibrate 145 mg, qd, Dr. Lorin Mercy put pt on it this past March. Please add to list.

## 2019-02-12 NOTE — Progress Notes (Signed)
Cardiology Office Note:    Date:  02/13/2019   ID:  Hannah Briggs, DOB 08/31/1931, MRN [id number]  PCP:  Melony Overly, MD will schedule here estimated 1 Cardiologist:  Shirlee More, MD    Referring MD: Melony Overly, MD    ASSESSMENT:    1. Nonrheumatic aortic valve stenosis   2. Hypertensive heart disease without heart failure    PLAN:    In order of problems listed above:  1. Aortic stenosis continues to be asymptomatic recheck echocardiogram plan to see in the office in 1 year unless she has progressed to severe. 2. Hypertension stable BP at target continue treatment including calcium channel blocker ACE inhibitor 3. Stable dyslipidemia she has multiple intolerances to lipid-lowering medications will continue her Zetia.   Next appointment: 1 year   Medication Adjustments/Labs and Tests Ordered: Current medicines are reviewed at length with the patient today.  Concerns regarding medicines are outlined above.  No orders of the defined types were placed in this encounter.  No orders of the defined types were placed in this encounter.   No chief complaint on file.   History of Present Illness:    Hannah Briggs is a 85 y.o. female with a hx of mild AS with P/M 21/14 mm H in 2018, , hypertension, CKD  and hyperlipidemia   last seen 11/10/2018. Compliance with diet, lifestyle and medications: Yes  Overall she is done well she is intolerant of fenofibrate with GI upset.  She is really struggling with social isolation during COVID-19 but has had no edema shortness of breath orthopnea chest pain or syncope.  She finds her self fatigued.  Recent labs with her PCP 09/21/2018 show cholesterol 201 HDL 60 LDL 93 A1c 6.2% hemoglobin normal 13.3 creatinine 1.0 potassium 4.1 and TSH in January 2019 was normal.  After discussion we decided to repeat her echocardiogram and I will plan to see him in 1 year unless she has severe stenosis. Past Medical History:  Diagnosis Date  .  Pure hypercholesterolemia 04/08/2015   Overview:  Intolerant of pravastatin and simvastatin  . Rectal cancer [facility])     Past Surgical History:  Procedure Laterality Date  . APPENDECTOMY    . BLADDER SURGERY    . BREAST BIOPSY    . CESAREAN SECTION    . CHOLECYSTECTOMY    . COLON SURGERY    . EUS N/A 02/24/2017   Procedure: LOWER ENDOSCOPIC ULTRASOUND (EUS);  Surgeon: Milus Banister, MD;  Location: [facility];  Service: Endoscopy;  Laterality: N/A;  . TONSILLECTOMY      Current Medications: Current Meds  Medication Sig  . acetaminophen (TYLENOL 8 HOUR) 650 MG CR tablet Take 650 mg by mouth 2 (two) times a day.  Marland Kitchen amLODipine (NORVASC) 5 MG tablet Take 5 mg by mouth 2 (two) times a day.   Marland Kitchen aspirin EC 81 MG tablet Take 81 mg by mouth daily.  . benazepril (LOTENSIN) 40 MG tablet Take 40 mg by mouth daily.  . calcium carbonate (TUMS EX) 750 MG chewable tablet Chew 1 tablet by mouth daily as needed for heartburn.  . Calcium Carbonate-Vitamin D (CALCIUM 600+D PO) Take 1 tablet by mouth daily.  . cephALEXin (KEFLEX) 250 MG capsule Take 250 mg by mouth daily.  . cholecalciferol (VITAMIN D) 1000 units tablet Take 1,000 Units by mouth daily.  . Coenzyme Q10 (COQ10) 100 MG CAPS Take 100 mg by mouth at bedtime.  Marland Kitchen ezetimibe (ZETIA) 10 MG tablet  Take 10 mg by mouth every evening.  . hydrochlorothiazide (HYDRODIURIL) 12.5 MG tablet Take 12.5 mg by mouth daily.  . montelukast (SINGULAIR) 10 MG tablet Take 1 tablet by mouth daily.  . Multiple Vitamin (MULTIVITAMIN WITH MINERALS) TABS tablet Take 1 tablet by mouth daily.  Marland Kitchen omeprazole (PRILOSEC) 20 MG capsule Take 20 mg by mouth every morning.  Marland Kitchen Phenazopyridine HCl (AZO URINARY PAIN RELIEF PO) Take 3 tablets by mouth 3 (three) times daily as needed (uti symptoms).  . Potassium 99 MG TABS Take 99 mg by mouth daily.  . pravastatin (PRAVACHOL) 20 MG tablet Take 40 mg by mouth every Monday, Wednesday, and Friday. At night   . Probiotic CAPS Take 1  capsule by mouth daily.  . Turmeric 500 MG CAPS Take 1 capsule by mouth daily.  . Wheat Dextrin (BENEFIBER DRINK MIX) PACK Take 1 Package by mouth every other day.     Allergies:   Claritin-d 12 hour [loratadine-pseudoephedrine er], Codeine, Lisinopril, and Statins   Social History   Socioeconomic History  . Marital status: Widowed    Spouse name: Not on file  . Number of children: Not on file  . Years of education: Not on file  . Highest education level: Not on file  Occupational History  . Not on file  Social Needs  . Financial resource strain: Not on file  . Food insecurity    Worry: Not on file    Inability: Not on file  . Transportation needs    Medical: Not on file    Non-medical: Not on file  Tobacco Use  . Smoking status: Never Smoker  . Smokeless tobacco: Never Used  Substance and Sexual Activity  . Alcohol use: No  . Drug use: No  . Sexual activity: Not on file  Lifestyle  . Physical activity    Days per week: Not on file    Minutes per session: Not on file  . Stress: Not on file  Relationships  . Social Herbalist on phone: Not on file    Gets together: Not on file    Attends religious service: Not on file    Active member of club or organization: Not on file    Attends meetings of clubs or organizations: Not on file    Relationship status: Not on file  Other Topics Concern  . Not on file  Social History Narrative  . Not on file     Family History: The patient's family history includes AAA (abdominal aortic aneurysm) in her brother; Congestive Heart Failure in her father; Heart attack in her mother; Heart disease in her sister; Parkinson's disease in her sister. ROS:   Please see the history of present illness.    All other systems reviewed and are negative.  EKGs/Labs/Other Studies Reviewed:    The following studies were reviewed today:  EKG:  EKG ordered today and personally reviewed.  The ekg ordered today demonstrates sinus rhythm  right axis deviation otherwise normal   Physical Exam:    VS:  BP (!) 144/80 (BP Location: Right Arm, Patient Position: Sitting, Cuff Size: Normal)   Pulse 97   Temp 98.2 F (36.8 C)   Ht 4\' 11"  (1.499 m)   Wt 154 lb 12.8 oz (70.2 kg)   SpO2 98%   BMI 31.27 kg/m     Wt Readings from Last 3 Encounters:  02/13/19 154 lb 12.8 oz (70.2 kg)  11/10/18 158 lb (71.7 kg)  04/04/18 153  lb (69.4 kg)     GEN:  Well nourished, well developed in no acute distress HEENT: Normal NECK: No JVD; No carotid bruits LYMPHATICS: No lymphadenopathy CARDIAC: Grade 2/6 to 3/6 mid peaking aortic ejection murmur does not encompass S2 RRR, no  rubs, gallops RESPIRATORY:  Clear to auscultation without rales, wheezing or rhonchi  ABDOMEN: Soft, non-tender, non-distended MUSCULOSKELETAL:  No edema; No deformity  SKIN: Warm and dry NEUROLOGIC:  Alert and oriented x 3 PSYCHIATRIC:  Normal affect    Signed, Shirlee More, MD  02/13/2019 11:40 AM    [place]

## 2019-02-13 ENCOUNTER — Encounter: Payer: Self-pay | Admitting: Cardiology

## 2019-02-13 ENCOUNTER — Other Ambulatory Visit: Payer: Self-pay

## 2019-02-13 ENCOUNTER — Ambulatory Visit (INDEPENDENT_AMBULATORY_CARE_PROVIDER_SITE_OTHER): Payer: Medicare Other | Admitting: Cardiology

## 2019-02-13 VITALS — BP 144/80 | HR 97 | Temp 98.2°F | Ht 59.0 in | Wt 154.8 lb

## 2019-02-13 DIAGNOSIS — I119 Hypertensive heart disease without heart failure: Secondary | ICD-10-CM

## 2019-02-13 DIAGNOSIS — I350 Nonrheumatic aortic (valve) stenosis: Secondary | ICD-10-CM

## 2019-02-13 NOTE — Patient Instructions (Signed)
Medication Instructions:  Your physician recommends that you continue on your current medications as directed. Please refer to the Current Medication list given to you today.  If you need a refill on your cardiac medications before your next appointment, please call your pharmacy.   Lab work: None  If you have labs (blood work) drawn today and your tests are completely normal, you will receive your results only by: Marland Kitchen MyChart Message (if you have MyChart) OR . A paper copy in the mail If you have any lab test that is abnormal or we need to change your treatment, we will call you to review the results.  Testing/Procedures: You had an EKG today.   Your physician has requested that you have an echocardiogram. Echocardiography is a painless test that uses sound waves to create images of your heart. It provides your doctor with information about the size and shape of your heart and how well your heart's chambers and valves are working. This procedure takes approximately one hour. There are no restrictions for this procedure.  Follow-Up: At [facility], you and your health needs are our priority.  As part of our continuing mission to provide you with exceptional heart care, we have created designated Provider Care Teams.  These Care Teams include your primary Cardiologist (physician) and Advanced Practice Providers (APPs -  Physician Assistants and Nurse Practitioners) who all work together to provide you with the care you need, when you need it. You will need a follow up appointment in 1 years.  Please call our office 2 months in advance to schedule this appointment.

## 2019-02-21 ENCOUNTER — Other Ambulatory Visit: Payer: Self-pay

## 2019-02-21 ENCOUNTER — Ambulatory Visit (INDEPENDENT_AMBULATORY_CARE_PROVIDER_SITE_OTHER): Payer: Medicare Other

## 2019-02-21 DIAGNOSIS — I350 Nonrheumatic aortic (valve) stenosis: Secondary | ICD-10-CM

## 2019-02-21 DIAGNOSIS — I119 Hypertensive heart disease without heart failure: Secondary | ICD-10-CM | POA: Diagnosis not present

## 2019-02-21 NOTE — Progress Notes (Signed)
Complete echocardiogram has been performed.  Jimmy Kaylamarie Swickard RDCS, RVT 

## 2019-02-23 DIAGNOSIS — N390 Urinary tract infection, site not specified: Secondary | ICD-10-CM | POA: Diagnosis not present

## 2019-03-09 DIAGNOSIS — R970 Elevated carcinoembryonic antigen [CEA]: Secondary | ICD-10-CM | POA: Diagnosis not present

## 2019-03-09 DIAGNOSIS — Z85048 Personal history of other malignant neoplasm of rectum, rectosigmoid junction, and anus: Secondary | ICD-10-CM | POA: Diagnosis not present

## 2019-03-09 DIAGNOSIS — C20 Malignant neoplasm of rectum: Secondary | ICD-10-CM | POA: Diagnosis not present

## 2019-03-20 DIAGNOSIS — N390 Urinary tract infection, site not specified: Secondary | ICD-10-CM | POA: Diagnosis not present

## 2019-03-28 DIAGNOSIS — K219 Gastro-esophageal reflux disease without esophagitis: Secondary | ICD-10-CM | POA: Diagnosis not present

## 2019-03-28 DIAGNOSIS — R7303 Prediabetes: Secondary | ICD-10-CM | POA: Diagnosis not present

## 2019-03-28 DIAGNOSIS — D72829 Elevated white blood cell count, unspecified: Secondary | ICD-10-CM | POA: Diagnosis not present

## 2019-03-28 DIAGNOSIS — N183 Chronic kidney disease, stage 3 (moderate): Secondary | ICD-10-CM | POA: Diagnosis not present

## 2019-03-28 DIAGNOSIS — E785 Hyperlipidemia, unspecified: Secondary | ICD-10-CM | POA: Diagnosis not present

## 2019-03-28 DIAGNOSIS — I129 Hypertensive chronic kidney disease with stage 1 through stage 4 chronic kidney disease, or unspecified chronic kidney disease: Secondary | ICD-10-CM | POA: Diagnosis not present

## 2019-03-28 DIAGNOSIS — E559 Vitamin D deficiency, unspecified: Secondary | ICD-10-CM | POA: Diagnosis not present

## 2019-04-09 DIAGNOSIS — Z139 Encounter for screening, unspecified: Secondary | ICD-10-CM | POA: Diagnosis not present

## 2019-04-09 DIAGNOSIS — Z0000 Encounter for general adult medical examination without abnormal findings: Secondary | ICD-10-CM | POA: Diagnosis not present

## 2019-04-09 DIAGNOSIS — Z1231 Encounter for screening mammogram for malignant neoplasm of breast: Secondary | ICD-10-CM | POA: Diagnosis not present

## 2019-04-09 DIAGNOSIS — Z9181 History of falling: Secondary | ICD-10-CM | POA: Diagnosis not present

## 2019-04-09 DIAGNOSIS — E785 Hyperlipidemia, unspecified: Secondary | ICD-10-CM | POA: Diagnosis not present

## 2019-05-01 DIAGNOSIS — Z1231 Encounter for screening mammogram for malignant neoplasm of breast: Secondary | ICD-10-CM | POA: Diagnosis not present

## 2019-05-16 DIAGNOSIS — N189 Chronic kidney disease, unspecified: Secondary | ICD-10-CM | POA: Diagnosis not present

## 2019-05-16 DIAGNOSIS — D631 Anemia in chronic kidney disease: Secondary | ICD-10-CM | POA: Diagnosis not present

## 2019-05-16 DIAGNOSIS — N183 Chronic kidney disease, stage 3 unspecified: Secondary | ICD-10-CM | POA: Diagnosis not present

## 2019-05-16 DIAGNOSIS — E559 Vitamin D deficiency, unspecified: Secondary | ICD-10-CM | POA: Diagnosis not present

## 2019-05-16 DIAGNOSIS — Z7689 Persons encountering health services in other specified circumstances: Secondary | ICD-10-CM | POA: Diagnosis not present

## 2019-05-16 DIAGNOSIS — N1831 Chronic kidney disease, stage 3a: Secondary | ICD-10-CM | POA: Diagnosis not present

## 2019-05-16 DIAGNOSIS — N3090 Cystitis, unspecified without hematuria: Secondary | ICD-10-CM | POA: Diagnosis not present

## 2019-05-16 DIAGNOSIS — I129 Hypertensive chronic kidney disease with stage 1 through stage 4 chronic kidney disease, or unspecified chronic kidney disease: Secondary | ICD-10-CM | POA: Diagnosis not present

## 2019-06-18 DIAGNOSIS — N390 Urinary tract infection, site not specified: Secondary | ICD-10-CM | POA: Diagnosis not present

## 2019-06-22 ENCOUNTER — Other Ambulatory Visit: Payer: Self-pay

## 2019-06-22 ENCOUNTER — Encounter: Payer: Self-pay | Admitting: Cardiology

## 2019-06-22 ENCOUNTER — Ambulatory Visit (INDEPENDENT_AMBULATORY_CARE_PROVIDER_SITE_OTHER): Payer: Medicare Other | Admitting: Cardiology

## 2019-06-22 VITALS — BP 144/68 | HR 102 | Ht 59.0 in | Wt 153.6 lb

## 2019-06-22 DIAGNOSIS — I119 Hypertensive heart disease without heart failure: Secondary | ICD-10-CM | POA: Diagnosis not present

## 2019-06-22 DIAGNOSIS — E785 Hyperlipidemia, unspecified: Secondary | ICD-10-CM

## 2019-06-22 DIAGNOSIS — I350 Nonrheumatic aortic (valve) stenosis: Secondary | ICD-10-CM

## 2019-06-22 NOTE — Progress Notes (Signed)
Cardiology Office Note:    Date:  06/22/2019   ID:  Hannah Briggs, DOB Sep 10, 1931, MRN [id number]  PCP:  Melony Overly, MD  Cardiologist:  Shirlee More, MD    Referring MD: Melony Overly, MD    ASSESSMENT:    1. Nonrheumatic aortic valve stenosis   2. Hypertensive heart disease without heart failure   3. Dyslipidemia    PLAN:    In order of problems listed above:  1. Her aortic stenosis is approaching severe and symptomatic she will need intervention in the near future she wants to delay as long as possible she will watch her symptoms we will recheck an echo in February and see me in March I suspect we will need to make plans at that time.  If symptoms worsen she will contact me and we will arrange for an accelerated evaluation with right and left heart catheterization as preparation for TAVR.  Complicated medical decision making occurred during this visit include shared decision making and plans for close clinical supervision and follow-up and greater than 25 minutes was spent face-to-face with the patient during this complex visit. 2. Stable hypertension BP at target no evidence of heart failure continue current treatment With calcium channel blocker ACE inhibitor and hydrochlorothiazide.  With age and aortic stenosis is important avoid overtreatment and strong direct peripheral vasodilators.  To continue to monitor blood pressure at home. 3. Stable continue combined lipid-lowering therapy with statin and Zetia  Next appointment: March after echocardiogram February   Medication Adjustments/Labs and Tests Ordered: Current medicines are reviewed at length with the patient today.  Concerns regarding medicines are outlined above.  No orders of the defined types were placed in this encounter.  No orders of the defined types were placed in this encounter.   Chief Complaint  Patient presents with  . Follow-up  . Aortic Stenosis    History of Present Illness:    Hannah Briggs is a 85 y.o. female with a hx of  mild AS with P/M 21/14 mm H in 2018, , hypertension, CKD  and hyperlipidemia  last seen 02/13/2019.  Echocardiogram August 2020 now shows moderate to severe low-flow aortic stenosis. Compliance with diet, lifestyle and medications: Yes  I reviewed her echocardiogram with the patient she understands that her aortic stenosis is worsening  Echo 02/22/2019:  1. The left ventricle has hyperdynamic systolic function, with an ejection fraction of >65%. The cavity size was normal. There is severe concentric left ventricular hypertrophy. Left ventricular diastolic Doppler parameters are consistent with impaired relaxation. Elevated mean left atrial pressure No evidence of left ventricular regional wall motion abnormalities.  2. The right ventricle has normal systolic function. The cavity was normal. There is no increase in right ventricular wall thickness.  3. Left atrial size was moderately dilated.  4. The mitral valve is degenerative. Mild thickening of the mitral valve leaflet. There is moderate mitral annular calcification present. No evidence of mitral valve stenosis.  5. The aortic valve is tricuspid. Moderate thickening of the aortic valve. Moderate calcification of the aortic valve. Aortic valve regurgitation is mild by color flow Doppler. Moderate to Severe stenosis of the aortic valve. AORTIC VALVE                   AV Vmax:          407.00 cm/s  AV Vmean:         291.000 cm/s AV Peak Grad:  66.3 mmHg    AV Mean Grad:     39.5 mmHg    LVOT/AV VTI ratio:0.29      6. The aorta is normal in size and structure.  7. The aortic root and ascending aorta are normal in size and structure.  She is quite frightened and apprehensive about COVID-19.  She acknowledges that she is more breathless with more than usual activities but no edema orthopnea chest pain palpitation or syncope.  I reviewed her previous echocardiogram with her [facility]  August 2019 and she clearly has progressed from moderate to moderate severe and is becoming symptomatic.  She is aware that she is approaching indication for valve intervention TAVR wants to delay as long as possible we will recheck echocardiogram February and see me afterwards.  Difficult decision making here balancing the concerns of COVID-19 her decision to delay intervention as much as possible and the natural history of her disease and the potential for abrupt deterioration with severe symptomatic aortic stenosis.  I had the opportunity in the office to review her most recent echocardiogram with her to discuss the significance of her symptoms of exercise intolerance and exertional shortness of breath caution her of progression especially with syncope in which shared decision making with the patient formulate a plan for follow-up in the near future.  I asked her if symptoms progress to be in touch.  She does not have angina and recent medical literature would imply she would not benefit from any prophylactic revascularization at the time of TAVR.  Her blood pressure is reasonably controlled #1 avoid overtreatment and vasodilators that can worsen gradient or cause hypotension.  She will continue her current lipid-lowering treatment although its not directly beneficial to slow the progression of this disease process. Past Medical History:  Diagnosis Date  . Pure hypercholesterolemia 04/08/2015   Overview:  Intolerant of pravastatin and simvastatin  . Rectal cancer [facility])     Past Surgical History:  Procedure Laterality Date  . APPENDECTOMY    . BLADDER SURGERY    . BREAST BIOPSY    . CESAREAN SECTION    . CHOLECYSTECTOMY    . COLON SURGERY    . EUS N/A 02/24/2017   Procedure: LOWER ENDOSCOPIC ULTRASOUND (EUS);  Surgeon: Milus Banister, MD;  Location: [facility];  Service: Endoscopy;  Laterality: N/A;  . TONSILLECTOMY      Current Medications: Current Meds  Medication Sig  . acetaminophen  (TYLENOL 8 HOUR) 650 MG CR tablet Take 650 mg by mouth 2 (two) times a day.  Marland Kitchen amLODipine (NORVASC) 10 MG tablet Take 10 mg by mouth daily.   Marland Kitchen aspirin EC 81 MG tablet Take 81 mg by mouth daily.  . benazepril (LOTENSIN) 40 MG tablet Take 40 mg by mouth daily.  . calcium carbonate (TUMS EX) 750 MG chewable tablet Chew 1 tablet by mouth daily as needed for heartburn.  . Calcium Carbonate-Vitamin D (CALCIUM 600+D PO) Take 1 tablet by mouth daily.  . cholecalciferol (VITAMIN D) 1000 units tablet Take 1,000 Units by mouth daily.  . Coenzyme Q10 (COQ10) 100 MG CAPS Take 100 mg by mouth at bedtime.  Marland Kitchen ezetimibe (ZETIA) 10 MG tablet Take 10 mg by mouth every evening.  . hydrochlorothiazide (HYDRODIURIL) 12.5 MG tablet Take 12.5 mg by mouth daily.  . montelukast (SINGULAIR) 10 MG tablet Take 1 tablet by mouth daily.  . Multiple Vitamin (MULTIVITAMIN WITH MINERALS) TABS tablet Take 1 tablet by mouth daily.  Marland Kitchen omeprazole (PRILOSEC) 20  MG capsule Take 20 mg by mouth every morning.  Marland Kitchen Phenazopyridine HCl (AZO URINARY PAIN RELIEF PO) Take 3 tablets by mouth 3 (three) times daily as needed (uti symptoms).  . Potassium 99 MG TABS Take 99 mg by mouth daily.  . pravastatin (PRAVACHOL) 20 MG tablet Take 40 mg by mouth every Monday, Wednesday, and Friday. At night   . Probiotic CAPS Take 1 capsule by mouth daily.  . Turmeric 500 MG CAPS Take 1 capsule by mouth daily.  . Wheat Dextrin (BENEFIBER DRINK MIX) PACK Take 1 Package by mouth every other day.     Allergies:   Claritin-d 12 hour [loratadine-pseudoephedrine er], Codeine, Lisinopril, and Statins   Social History   Socioeconomic History  . Marital status: Widowed    Spouse name: Not on file  . Number of children: Not on file  . Years of education: Not on file  . Highest education level: Not on file  Occupational History  . Not on file  Social Needs  . Financial resource strain: Not on file  . Food insecurity    Worry: Not on file    Inability:  Not on file  . Transportation needs    Medical: Not on file    Non-medical: Not on file  Tobacco Use  . Smoking status: Never Smoker  . Smokeless tobacco: Never Used  Substance and Sexual Activity  . Alcohol use: No  . Drug use: No  . Sexual activity: Not on file  Lifestyle  . Physical activity    Days per week: Not on file    Minutes per session: Not on file  . Stress: Not on file  Relationships  . Social Herbalist on phone: Not on file    Gets together: Not on file    Attends religious service: Not on file    Active member of club or organization: Not on file    Attends meetings of clubs or organizations: Not on file    Relationship status: Not on file  Other Topics Concern  . Not on file  Social History Narrative  . Not on file     Family History: The patient's family history includes AAA (abdominal aortic aneurysm) in her brother; Congestive Heart Failure in her father; Heart attack in her mother; Heart disease in her sister; Parkinson's disease in her sister. ROS:   Please see the history of present illness.    All other systems reviewed and are negative.  EKGs/Labs/Other Studies Reviewed:    The following studies were reviewed today:  Recent Labs: 03/28/2019 cholesterol 233 HDL 54 LDL 9310 2820 creatinine 1.23  Physical Exam:    VS:  BP (!) 144/68 (BP Location: Left Arm, Patient Position: Sitting, Cuff Size: Normal)   Pulse (!) 102   Ht 4\' 11"  (1.499 m)   Wt 153 lb 9.6 oz (69.7 kg)   SpO2 93%   BMI 31.02 kg/m     Wt Readings from Last 3 Encounters:  06/22/19 153 lb 9.6 oz (69.7 kg)  02/13/19 154 lb 12.8 oz (70.2 kg)  11/10/18 158 lb (71.7 kg)     GEN:  Well nourished, well developed in no acute distress HEENT: Normal NECK: No JVD; No carotid bruits LYMPHATICS: No lymphadenopathy CARDIAC: 3-4 6 harsh ejection murmur throughout systole encompasses S2 radiates up into the right clavicular area significant aortic stenosis RRR, no rubs,  gallops RESPIRATORY:  Clear to auscultation without rales, wheezing or rhonchi  ABDOMEN: Soft, non-tender, non-distended  MUSCULOSKELETAL:  No edema; No deformity  SKIN: Warm and dry NEUROLOGIC:  Alert and oriented x 3 PSYCHIATRIC:  Normal affect    Signed, Shirlee More, MD  06/22/2019 3:20 PM    [facility]

## 2019-06-22 NOTE — Patient Instructions (Addendum)
Medication Instructions:  Your physician recommends that you continue on your current medications as directed. Please refer to the Current Medication list given to you today.  *If you need a refill on your cardiac medications before your next appointment, please call your pharmacy*  Lab Work: None  If you have labs (blood work) drawn today and your tests are completely normal, you will receive your results only by: Marland Kitchen MyChart Message (if you have MyChart) OR . A paper copy in the mail If you have any lab test that is abnormal or we need to change your treatment, we will call you to review the results.  Testing/Procedures: Your physician has requested that you have an echocardiogram. Echocardiography is a painless test that uses sound waves to create images of your heart. It provides your doctor with information about the size and shape of your heart and how well your heart's chambers and valves are working. This procedure takes approximately one hour. There are no restrictions for this procedure. This will be scheduled for February 2021 in the [place] office.   Follow-Up: At [facility], you and your health needs are our priority.  As part of our continuing mission to provide you with exceptional heart care, we have created designated Provider Care Teams.  These Care Teams include your primary Cardiologist (physician) and Advanced Practice Providers (APPs -  Physician Assistants and Nurse Practitioners) who all work together to provide you with the care you need, when you need it.  Your next appointment:   3 month(s)  The format for your next appointment:   In Person  Provider:   Shirlee More, MD

## 2019-07-23 DIAGNOSIS — R339 Retention of urine, unspecified: Secondary | ICD-10-CM | POA: Diagnosis not present

## 2019-07-23 DIAGNOSIS — N390 Urinary tract infection, site not specified: Secondary | ICD-10-CM | POA: Diagnosis not present

## 2019-08-29 ENCOUNTER — Other Ambulatory Visit: Payer: Self-pay

## 2019-08-29 ENCOUNTER — Ambulatory Visit (INDEPENDENT_AMBULATORY_CARE_PROVIDER_SITE_OTHER): Payer: Medicare Other

## 2019-08-29 DIAGNOSIS — I350 Nonrheumatic aortic (valve) stenosis: Secondary | ICD-10-CM | POA: Diagnosis not present

## 2019-08-29 DIAGNOSIS — I119 Hypertensive heart disease without heart failure: Secondary | ICD-10-CM

## 2019-08-29 NOTE — Progress Notes (Signed)
Complete echocardiogram has been performed.  Jimmy Aarin Sparkman RDCS, RVT 

## 2019-08-31 ENCOUNTER — Telehealth: Payer: Self-pay | Admitting: Cardiology

## 2019-08-31 NOTE — Telephone Encounter (Signed)
New message:     Patient states some one called him concerning some results. I did not see a note, but she had a ECHO. Please call patient back.

## 2019-08-31 NOTE — Telephone Encounter (Signed)
Telephone call to patient. Informed of echo results.

## 2019-09-11 DIAGNOSIS — N390 Urinary tract infection, site not specified: Secondary | ICD-10-CM | POA: Diagnosis not present

## 2019-09-25 DIAGNOSIS — C20 Malignant neoplasm of rectum: Secondary | ICD-10-CM | POA: Diagnosis not present

## 2019-09-25 DIAGNOSIS — I129 Hypertensive chronic kidney disease with stage 1 through stage 4 chronic kidney disease, or unspecified chronic kidney disease: Secondary | ICD-10-CM | POA: Diagnosis not present

## 2019-09-25 DIAGNOSIS — E559 Vitamin D deficiency, unspecified: Secondary | ICD-10-CM | POA: Diagnosis not present

## 2019-09-25 DIAGNOSIS — K219 Gastro-esophageal reflux disease without esophagitis: Secondary | ICD-10-CM | POA: Diagnosis not present

## 2019-09-25 DIAGNOSIS — E785 Hyperlipidemia, unspecified: Secondary | ICD-10-CM | POA: Diagnosis not present

## 2019-09-25 DIAGNOSIS — N183 Chronic kidney disease, stage 3 unspecified: Secondary | ICD-10-CM | POA: Diagnosis not present

## 2019-09-25 DIAGNOSIS — R7303 Prediabetes: Secondary | ICD-10-CM | POA: Diagnosis not present

## 2019-09-26 DIAGNOSIS — L570 Actinic keratosis: Secondary | ICD-10-CM | POA: Diagnosis not present

## 2019-10-01 NOTE — Progress Notes (Signed)
Cardiology Office Note:    Date:  10/02/2019   ID:  [name], DOB [birth year], MRN [id number]  PCP:  Renaldo Reel, PA  Cardiologist:  Shirlee More, MD    Referring MD: Melony Overly, MD    ASSESSMENT:    1. Nonrheumatic aortic valve stenosis   2. Hypertensive heart disease without heart failure   3. Dyslipidemia   4. Stage 2 chronic kidney disease    PLAN:    In order of problems listed above:  1. This progress to her aortic stenosis severe understands and accepts need for intervention or referred to left and right heart catheterization in preparation for either surgical aortic valve replacement or TAVR. 2. Stable blood pressure continue current treatment ACE inhibitor calcium channel blocker 3. Lipids at target continue statin and fish oil along with fenofibrate 4. Will proceed with coronary angiography   Next appointment: 6 weeks   Medication Adjustments/Labs and Tests Ordered: Current medicines are reviewed at length with the patient today.  Concerns regarding medicines are outlined above.  Orders Placed This Encounter  Procedures  . EKG 12-Lead   No orders of the defined types were placed in this encounter.   Chief Complaint  Patient presents with  . Follow-up  . Aortic Stenosis    History of Present Illness:    Hannah Briggs is a 86 y.o. female with a hx of aortic stenosis, hypertension, CKD  and hyperlipidemia  last seen 02/13/2019  last seen 06/22/2019. Compliance with diet, lifestyle and medications: Yes  I reviewed her echocardiogram.  She runs in the office and tells me she is ready for TAVR.  She is a different woman her strength and endurance is diminished with activities she is short of breath which forces him to stop and rest.  Fortunately she has had both doses of Pfizer COVID-19 vaccine.  No chest pain or syncope no edema orthopnea no fever or chills.  She has no dye allergy and has mild CKD recent creatinine 1.20 309 2021.  Risk benefits  and options detailed with the patient informed consent obtained  Echo 08/29/2019: 1. Left ventricular ejection fraction, by estimation, is 65 to 70%. The  left ventricle has normal function. The left ventrical has no regional  wall motion abnormalities. There is moderately increased left ventricular  hypertrophy. Left ventricular  diastolic function could not be evaluated.  2. Left atrial size was moderately dilated.  3. MS visually appears to be at least moderate, however MVA by P1/2 is  only 2.72 cm2. Gradent accross MV significantly elevated. Consider TEE for  better assessment of MS. The mitral valve is normal in structure and  function. no evidence of mitral valve  regurgitation. Mild to moderate mitral stenosis.  4. Aortic valve regurgitation is not visualized. Severe aortic valve  stenosis.    She has had progression of the AS as compared to study from 02/21/2019. Peak/mean  gradient was 66/39 mmHg, DI was .29. Now Peak/mean gradient 79/48 mmHg,  AVA 08 cm2, DI 0.26 with low flow stroke-volume index 20 cc/m.  Past Medical History:  Diagnosis Date  . Aortic stenosis 03/09/2017  . CKD (chronic kidney disease) 03/08/2017  . Dyslipidemia 03/08/2017   Statin intolerance  . GERD (gastroesophageal reflux disease) 03/08/2017  . Heart murmur 03/08/2017  . Hemoptysis 04/04/2018  . Hypertensive heart disease 03/08/2017  . Prediabetes 03/08/2017  . Preoperative cardiovascular examination 03/10/2017  . Pure hypercholesterolemia 04/08/2015   Overview:  Intolerant of pravastatin and simvastatin  .  Rectal cancer ([place])   . Vitamin D deficiency 03/08/2017    Past Surgical History:  Procedure Laterality Date  . APPENDECTOMY    . BLADDER SURGERY    . BREAST BIOPSY    . CESAREAN SECTION    . CHOLECYSTECTOMY    . COLON SURGERY    . EUS N/A 02/24/2017   Procedure: LOWER ENDOSCOPIC ULTRASOUND (EUS);  Surgeon: Milus Banister, MD;  Location: [facility];  Service: Endoscopy;  Laterality: N/A;   . TONSILLECTOMY      Current Medications: Current Meds  Medication Sig  . amLODipine (NORVASC) 10 MG tablet Take 10 mg by mouth daily.   Marland Kitchen aspirin EC 81 MG tablet Take 81 mg by mouth daily.  . benazepril (LOTENSIN) 40 MG tablet Take 40 mg by mouth daily.  . calcium carbonate (TUMS EX) 750 MG chewable tablet Chew 1 tablet by mouth daily as needed for heartburn.  . Calcium Carbonate-Vitamin D (CALCIUM 600+D PO) Take 1 tablet by mouth daily.  . cholecalciferol (VITAMIN D) 1000 units tablet Take 1,000 Units by mouth daily.  . Coenzyme Q10 (COQ10) 100 MG CAPS Take 100 mg by mouth at bedtime.  Marland Kitchen ezetimibe (ZETIA) 10 MG tablet Take 10 mg by mouth every evening.  . fenofibrate (TRICOR) 145 MG tablet Take 145 mg by mouth daily.  . fluconazole (DIFLUCAN) 150 MG tablet   . hydrochlorothiazide (HYDRODIURIL) 12.5 MG tablet Take 12.5 mg by mouth daily.  . montelukast (SINGULAIR) 10 MG tablet Take 1 tablet by mouth daily.  . Multiple Vitamin (MULTIVITAMIN WITH MINERALS) TABS tablet Take 1 tablet by mouth daily.  Marland Kitchen omeprazole (PRILOSEC) 20 MG capsule Take 20 mg by mouth every morning.  Marland Kitchen Phenazopyridine HCl (AZO URINARY PAIN RELIEF PO) Take 3 tablets by mouth 3 (three) times daily as needed (uti symptoms).  . Potassium 99 MG TABS Take 99 mg by mouth daily.  . pravastatin (PRAVACHOL) 20 MG tablet Take 40 mg by mouth every Monday, Wednesday, and Friday. At night   . Probiotic CAPS Take 1 capsule by mouth daily.  Marland Kitchen tiZANidine (ZANAFLEX) 2 MG tablet Take 2 mg by mouth at bedtime.  . Turmeric 500 MG CAPS Take 1 capsule by mouth daily.  . Wheat Dextrin (BENEFIBER DRINK MIX) PACK Take 1 Package by mouth every other day.     Allergies:   Claritin-d 12 hour [loratadine-pseudoephedrine er], Codeine, Lisinopril, and Statins   Social History   Socioeconomic History  . Marital status: Widowed    Spouse name: Not on file  . Number of children: Not on file  . Years of education: Not on file  . Highest  education level: Not on file  Occupational History  . Not on file  Tobacco Use  . Smoking status: Never Smoker  . Smokeless tobacco: Never Used  Substance and Sexual Activity  . Alcohol use: No  . Drug use: No  . Sexual activity: Not on file  Other Topics Concern  . Not on file  Social History Narrative  . Not on file   Social Determinants of Health   Financial Resource Strain:   . Difficulty of Paying Living Expenses:   Food Insecurity:   . Worried About Charity fundraiser in the Last Year:   . Arboriculturist in the Last Year:   Transportation Needs:   . Film/video editor (Medical):   Marland Kitchen Lack of Transportation (Non-Medical):   Physical Activity:   . Days of Exercise per Week:   .  Minutes of Exercise per Session:   Stress:   . Feeling of Stress :   Social Connections:   . Frequency of Communication with Friends and Family:   . Frequency of Social Gatherings with Friends and Family:   . Attends Religious Services:   . Active Member of Clubs or Organizations:   . Attends Archivist Meetings:   Marland Kitchen Marital Status:      Family History: The patient's family history includes AAA (abdominal aortic aneurysm) in her brother; Congestive Heart Failure in her father; Heart attack in her mother; Heart disease in her sister; Parkinson's disease in her sister. ROS:   Please see the history of present illness.    All other systems reviewed and are negative.  EKGs/Labs/Other Studies Reviewed:    The following studies were reviewed today:  EKG:  EKG ordered today and personally reviewed.  The ekg ordered today demonstrates sinus tachycardia otherwise normal  Recent Labs: 09/25/2019: Cholesterol 177 HDL 63 LDL 93 creatinine 1.26  Physical Exam:    VS:  BP (!) 152/64   Pulse (!) 104   Temp 97.7 F (36.5 C)   Ht 4\' 11"  (1.499 m)   Wt 157 lb 3.2 oz (71.3 kg)   SpO2 96%   BMI 31.75 kg/m     Wt Readings from Last 3 Encounters:  10/02/19 157 lb 3.2 oz (71.3  kg)  06/22/19 153 lb 9.6 oz (69.7 kg)  02/13/19 154 lb 12.8 oz (70.2 kg)     GEN: She looks her age but not pulmonary well nourished, well developed in no acute distress HEENT: Normal NECK: No JVD; No carotid bruits LYMPHATICS: No lymphadenopathy CARDIAC: Grade 3/6 to 4/6 harsh grunting aortic outflow murmur encompasses S2 single radiates to the carotids RRR, no rubs, gallops RESPIRATORY:  Clear to auscultation without rales, wheezing or rhonchi  ABDOMEN: Soft, non-tender, non-distended MUSCULOSKELETAL:  No edema; No deformity  SKIN: Warm and dry NEUROLOGIC:  Alert and oriented x 3 PSYCHIATRIC:  Normal affect    Signed, Shirlee More, MD  10/02/2019 3:10 PM    [facility]

## 2019-10-01 NOTE — H&P (View-Only) (Signed)
Cardiology Office Note:    Date:  10/02/2019   ID:  Hannah Briggs, DOB [birth year], MRN [id number]  PCP:  Hannah Reel, PA  Cardiologist:  Hannah More, MD    Referring MD: Hannah Overly, MD    ASSESSMENT:    1. Nonrheumatic aortic valve stenosis   2. Hypertensive heart disease without heart failure   3. Dyslipidemia   4. Stage 2 chronic kidney disease    PLAN:    In order of problems listed above:  1. This progress to her aortic stenosis severe understands and accepts need for intervention or referred to left and right heart catheterization in preparation for either surgical aortic valve replacement or TAVR. 2. Stable blood pressure continue current treatment ACE inhibitor calcium channel blocker 3. Lipids at target continue statin and fish oil along with fenofibrate 4. Will proceed with coronary angiography   Next appointment: 6 weeks   Medication Adjustments/Labs and Tests Ordered: Current medicines are reviewed at length with the patient today.  Concerns regarding medicines are outlined above.  Orders Placed This Encounter  Procedures  . EKG 12-Lead   No orders of the defined types were placed in this encounter.   Chief Complaint  Patient presents with  . Follow-up  . Aortic Stenosis    History of Present Illness:    Hannah Briggs is a 86 y.o. female with a hx of aortic stenosis, hypertension, CKD  and hyperlipidemia  last seen 02/13/2019  last seen 06/22/2019. Compliance with diet, lifestyle and medications: Yes  I reviewed her echocardiogram.  She runs in the office and tells me she is ready for TAVR.  She is a different woman her strength and endurance is diminished with activities she is short of breath which forces him to stop and rest.  Fortunately she has had both doses of Pfizer COVID-19 vaccine.  No chest pain or syncope no edema orthopnea no fever or chills.  She has no dye allergy and has mild CKD recent creatinine 1.20 309 2021.  Risk benefits  and options detailed with the patient informed consent obtained  Echo 08/29/2019: 1. Left ventricular ejection fraction, by estimation, is 65 to 70%. The  left ventricle has normal function. The left ventrical has no regional  wall motion abnormalities. There is moderately increased left ventricular  hypertrophy. Left ventricular  diastolic function could not be evaluated.  2. Left atrial size was moderately dilated.  3. MS visually appears to be at least moderate, however MVA by P1/2 is  only 2.72 cm2. Gradent accross MV significantly elevated. Consider TEE for  better assessment of MS. The mitral valve is normal in structure and  function. no evidence of mitral valve  regurgitation. Mild to moderate mitral stenosis.  4. Aortic valve regurgitation is not visualized. Severe aortic valve  stenosis.    She has had progression of the AS as compared to study from 02/21/2019. Peak/mean  gradient was 66/39 mmHg, DI was .29. Now Peak/mean gradient 79/48 mmHg,  AVA 08 cm2, DI 0.26 with low flow stroke-volume index 20 cc/m.  Past Medical History:  Diagnosis Date  . Aortic stenosis 03/09/2017  . CKD (chronic kidney disease) 03/08/2017  . Dyslipidemia 03/08/2017   Statin intolerance  . GERD (gastroesophageal reflux disease) 03/08/2017  . Heart murmur 03/08/2017  . Hemoptysis 04/04/2018  . Hypertensive heart disease 03/08/2017  . Prediabetes 03/08/2017  . Preoperative cardiovascular examination 03/10/2017  . Pure hypercholesterolemia 04/08/2015   Overview:  Intolerant of pravastatin and simvastatin  .  Rectal cancer ([place])   . Vitamin D deficiency 03/08/2017    Past Surgical History:  Procedure Laterality Date  . APPENDECTOMY    . BLADDER SURGERY    . BREAST BIOPSY    . CESAREAN SECTION    . CHOLECYSTECTOMY    . COLON SURGERY    . EUS N/A 02/24/2017   Procedure: LOWER ENDOSCOPIC ULTRASOUND (EUS);  Surgeon: Milus Banister, MD;  Location: [facility];  Service: Endoscopy;  Laterality: N/A;   . TONSILLECTOMY      Current Medications: Current Meds  Medication Sig  . amLODipine (NORVASC) 10 MG tablet Take 10 mg by mouth daily.   Marland Kitchen aspirin EC 81 MG tablet Take 81 mg by mouth daily.  . benazepril (LOTENSIN) 40 MG tablet Take 40 mg by mouth daily.  . calcium carbonate (TUMS EX) 750 MG chewable tablet Chew 1 tablet by mouth daily as needed for heartburn.  . Calcium Carbonate-Vitamin D (CALCIUM 600+D PO) Take 1 tablet by mouth daily.  . cholecalciferol (VITAMIN D) 1000 units tablet Take 1,000 Units by mouth daily.  . Coenzyme Q10 (COQ10) 100 MG CAPS Take 100 mg by mouth at bedtime.  Marland Kitchen ezetimibe (ZETIA) 10 MG tablet Take 10 mg by mouth every evening.  . fenofibrate (TRICOR) 145 MG tablet Take 145 mg by mouth daily.  . fluconazole (DIFLUCAN) 150 MG tablet   . hydrochlorothiazide (HYDRODIURIL) 12.5 MG tablet Take 12.5 mg by mouth daily.  . montelukast (SINGULAIR) 10 MG tablet Take 1 tablet by mouth daily.  . Multiple Vitamin (MULTIVITAMIN WITH MINERALS) TABS tablet Take 1 tablet by mouth daily.  Marland Kitchen omeprazole (PRILOSEC) 20 MG capsule Take 20 mg by mouth every morning.  Marland Kitchen Phenazopyridine HCl (AZO URINARY PAIN RELIEF PO) Take 3 tablets by mouth 3 (three) times daily as needed (uti symptoms).  . Potassium 99 MG TABS Take 99 mg by mouth daily.  . pravastatin (PRAVACHOL) 20 MG tablet Take 40 mg by mouth every Monday, Wednesday, and Friday. At night   . Probiotic CAPS Take 1 capsule by mouth daily.  Marland Kitchen tiZANidine (ZANAFLEX) 2 MG tablet Take 2 mg by mouth at bedtime.  . Turmeric 500 MG CAPS Take 1 capsule by mouth daily.  . Wheat Dextrin (BENEFIBER DRINK MIX) PACK Take 1 Package by mouth every other day.     Allergies:   Claritin-d 12 hour [loratadine-pseudoephedrine er], Codeine, Lisinopril, and Statins   Social History   Socioeconomic History  . Marital status: Widowed    Spouse name: Not on file  . Number of children: Not on file  . Years of education: Not on file  . Highest  education level: Not on file  Occupational History  . Not on file  Tobacco Use  . Smoking status: Never Smoker  . Smokeless tobacco: Never Used  Substance and Sexual Activity  . Alcohol use: No  . Drug use: No  . Sexual activity: Not on file  Other Topics Concern  . Not on file  Social History Narrative  . Not on file   Social Determinants of Health   Financial Resource Strain:   . Difficulty of Paying Living Expenses:   Food Insecurity:   . Worried About Charity fundraiser in the Last Year:   . Arboriculturist in the Last Year:   Transportation Needs:   . Film/video editor (Medical):   Marland Kitchen Lack of Transportation (Non-Medical):   Physical Activity:   . Days of Exercise per Week:   .  Minutes of Exercise per Session:   Stress:   . Feeling of Stress :   Social Connections:   . Frequency of Communication with Friends and Family:   . Frequency of Social Gatherings with Friends and Family:   . Attends Religious Services:   . Active Member of Clubs or Organizations:   . Attends Archivist Meetings:   Marland Kitchen Marital Status:      Family History: The patient's family history includes AAA (abdominal aortic aneurysm) in her brother; Congestive Heart Failure in her father; Heart attack in her mother; Heart disease in her sister; Parkinson's disease in her sister. ROS:   Please see the history of present illness.    All other systems reviewed and are negative.  EKGs/Labs/Other Studies Reviewed:    The following studies were reviewed today:  EKG:  EKG ordered today and personally reviewed.  The ekg ordered today demonstrates sinus tachycardia otherwise normal  Recent Labs: 09/25/2019: Cholesterol 177 HDL 63 LDL 93 creatinine 1.26  Physical Exam:    VS:  BP (!) 152/64   Pulse (!) 104   Temp 97.7 F (36.5 C)   Ht 4\' 11"  (1.499 m)   Wt 157 lb 3.2 oz (71.3 kg)   SpO2 96%   BMI 31.75 kg/m     Wt Readings from Last 3 Encounters:  10/02/19 157 lb 3.2 oz (71.3  kg)  06/22/19 153 lb 9.6 oz (69.7 kg)  02/13/19 154 lb 12.8 oz (70.2 kg)     GEN: She looks her age but not pulmonary well nourished, well developed in no acute distress HEENT: Normal NECK: No JVD; No carotid bruits LYMPHATICS: No lymphadenopathy CARDIAC: Grade 3/6 to 4/6 harsh grunting aortic outflow murmur encompasses S2 single radiates to the carotids RRR, no rubs, gallops RESPIRATORY:  Clear to auscultation without rales, wheezing or rhonchi  ABDOMEN: Soft, non-tender, non-distended MUSCULOSKELETAL:  No edema; No deformity  SKIN: Warm and dry NEUROLOGIC:  Alert and oriented x 3 PSYCHIATRIC:  Normal affect    Signed, Hannah More, MD  10/02/2019 3:10 PM    [facility]

## 2019-10-02 ENCOUNTER — Encounter: Payer: Self-pay | Admitting: *Deleted

## 2019-10-02 ENCOUNTER — Encounter: Payer: Self-pay | Admitting: Cardiology

## 2019-10-02 ENCOUNTER — Other Ambulatory Visit: Payer: Self-pay

## 2019-10-02 ENCOUNTER — Ambulatory Visit: Payer: Medicare Other | Admitting: Cardiology

## 2019-10-02 DIAGNOSIS — E785 Hyperlipidemia, unspecified: Secondary | ICD-10-CM | POA: Diagnosis not present

## 2019-10-02 DIAGNOSIS — I119 Hypertensive heart disease without heart failure: Secondary | ICD-10-CM

## 2019-10-02 DIAGNOSIS — N182 Chronic kidney disease, stage 2 (mild): Secondary | ICD-10-CM | POA: Diagnosis not present

## 2019-10-02 DIAGNOSIS — I350 Nonrheumatic aortic (valve) stenosis: Secondary | ICD-10-CM

## 2019-10-02 NOTE — Patient Instructions (Addendum)
Medication Instructions:   Your physician recommends that you continue on your current medications as directed. Please refer to the Current Medication list given to you today.   *If you need a refill on your cardiac medications before your next appointment, please call your pharmacy*   Lab Work:   [place] 10-12-2019 [facility]  ( PROCEDURE 10-15-19)  If you have labs (blood work) drawn today and your tests are completely normal, you will receive your results only by: Marland Kitchen MyChart Message (if you have MyChart) OR . A paper copy in the mail If you have any lab test that is abnormal or we need to change your treatment, we will call you to review the results.   Testing/Procedures:  SEE LETTER Your physician has requested that you have a cardiac catheterization. Cardiac catheterization is used to diagnose and/or treat various heart conditions. Doctors may recommend this procedure for a number of different reasons. The most common reason is to evaluate chest pain. Chest pain can be a symptom of coronary artery disease (CAD), and cardiac catheterization can show whether plaque is narrowing or blocking your heart's arteries. This procedure is also used to evaluate the valves, as well as measure the blood flow and oxygen levels in different parts of your heart. For further information please visit [email or web address]. Please follow instruction sheet, as given.   Follow-Up: At [facility], you and your health needs are our priority.  As part of our continuing mission to provide you with exceptional heart care, we have created designated Provider Care Teams.  These Care Teams include your primary Cardiologist (physician) and Advanced Practice Providers (APPs -  Physician Assistants and Nurse Practitioners) who all work together to provide you with the care you need, when you need it.  We recommend signing up for the patient portal called "MyChart".  Sign up information  is provided on this After Visit Summary.  MyChart is used to connect with patients for Virtual Visits (Telemedicine).  Patients are able to view lab/test results, encounter notes, upcoming appointments, etc.  Non-urgent messages can be sent to your provider as well.   To learn more about what you can do with MyChart, go to [email or web address].    Your next appointment:   6 week(s)  The format for your next appointment:   In Person  Provider:   You will see Dr. Bettina Gavia  Or, you can be scheduled with the following Advanced Practice Provider on your designated Care Team (at our [facility]):  Laurann Montana, FNP     Other Instructions

## 2019-10-03 ENCOUNTER — Telehealth: Payer: Self-pay | Admitting: *Deleted

## 2019-10-03 LAB — BASIC METABOLIC PANEL
BUN/Creatinine Ratio: 15 (ref 12–28)
BUN: 19 mg/dL (ref 8–27)
CO2: 23 mmol/L (ref 20–29)
Calcium: 10.1 mg/dL (ref 8.7–10.3)
Chloride: 96 mmol/L (ref 96–106)
Creatinine, Ser: 1.27 mg/dL — ABNORMAL HIGH (ref 0.57–1.00)
GFR calc Af Amer: 44 mL/min/{1.73_m2} — ABNORMAL LOW (ref >59)
GFR calc non Af Amer: 39 mL/min/{1.73_m2} — ABNORMAL LOW (ref >59)
Glucose: 106 mg/dL — ABNORMAL HIGH (ref 65–99)
Potassium: 4.3 mmol/L (ref 3.5–5.2)
Sodium: 136 mmol/L (ref 134–144)

## 2019-10-03 LAB — CBC
Hematocrit: 37.9 % (ref 34.0–46.6)
Hemoglobin: 12.4 g/dL (ref 11.1–15.9)
MCH: 27.6 pg (ref 26.6–33.0)
MCHC: 32.7 g/dL (ref 31.5–35.7)
MCV: 84 fL (ref 79–97)
Platelets: 247 10*3/uL (ref 150–450)
RBC: 4.49 x10E6/uL (ref 3.77–5.28)
RDW: 13.1 % (ref 11.7–15.4)
WBC: 7.9 10*3/uL (ref 3.4–10.8)

## 2019-10-03 NOTE — Telephone Encounter (Signed)
-----   Message from Richardo Priest, MD sent at 10/03/2019 10:37 AM EDT ----- Normal or stable result  Good result for coronary angiography

## 2019-10-03 NOTE — Telephone Encounter (Signed)
Reviewed with Desiree Lucy, RN and she will contact patient with additional instructions closer to procedure. I spoke with patient and let her know results are stable.  I told her she would be receiving a call from procedure nurse Webb Silversmith) with additional instructions closer to catheterization date

## 2019-10-08 NOTE — Telephone Encounter (Addendum)
Pt states she is scheduled to have BMP done 10/10/19 at [facility] [place] in [place]. I advised patient I would follow up with her 10/11/19, if GFR <45 on 10/10/19 would make arrangements for pre procedure hydration prior to procedure scheduled for 10/15/19.

## 2019-10-10 DIAGNOSIS — I129 Hypertensive chronic kidney disease with stage 1 through stage 4 chronic kidney disease, or unspecified chronic kidney disease: Secondary | ICD-10-CM | POA: Diagnosis not present

## 2019-10-11 ENCOUNTER — Telehealth: Payer: Self-pay | Admitting: *Deleted

## 2019-10-11 NOTE — Telephone Encounter (Signed)
See phone note 10/03/19 for details.

## 2019-10-11 NOTE — Telephone Encounter (Addendum)
Pt contacted pre-catheterization scheduled at [facility] for: Monday October 15, 2019 9 AM Verified arrival time and place: [facility]) at: 7 AM    No solid food after midnight prior to cath, clear liquids until 5 AM day of procedure. Contrast allergy: no  Hold: Benicar-day before and day of procedure-GFR 49 HCTZ/KCl-day before and day of procedure-GFR 49  Except hold medications AM meds can be  taken pre-cath with sip of water including: ASA 81 mg   Confirmed patient has responsible adult to drive home post procedure and observe 24 hours after arriving home:  yes  Currently, due to Covid-19 pandemic, only one person will be allowed with patient. Must be the same person for patient's entire stay and will be required to wear a mask. They will be asked to wait in the waiting room for the duration of the patient's stay.  Patients are required to wear a mask when they enter the hospital.      COVID-19 Pre-Screening Questions:  . In the past 7 to 10 days have you had a cough,  shortness of breath, headache, congestion, fever (100 or greater) body aches, chills, sore throat, or sudden loss of taste or sense of smell?  Cough, not new . Have you been around anyone with known Covid 19 in the past 7-10 days? no . Have you been around anyone who is awaiting Covid 19 test results in the past 7 to 10 days? no . Have you been around anyone who has been exposed to Covid 19, or has mentioned symptoms of Covid 19 within the past 7 to 10 days? no  I reviewed procedure/mask/visitor instructions, COVID-19 screening questions with patient.

## 2019-10-11 NOTE — Telephone Encounter (Signed)
Pt had BMP 10/10/19-see results scanned to Epic-GFR 49.

## 2019-10-12 ENCOUNTER — Other Ambulatory Visit (HOSPITAL_COMMUNITY)
Admission: RE | Admit: 2019-10-12 | Discharge: 2019-10-12 | Disposition: A | Discharge status: Home / Self Care | Payer: Medicare Other | Source: Ambulatory Visit | Attending: Cardiovascular Disease | Admitting: Cardiovascular Disease

## 2019-10-12 DIAGNOSIS — Z20822 Contact with and (suspected) exposure to covid-19: Secondary | ICD-10-CM | POA: Insufficient documentation

## 2019-10-12 DIAGNOSIS — Z01812 Encounter for preprocedural laboratory examination: Secondary | ICD-10-CM | POA: Diagnosis not present

## 2019-10-12 LAB — SARS CORONAVIRUS 2 (TAT 6-24 HRS): SARS Coronavirus 2: NEGATIVE

## 2019-10-15 ENCOUNTER — Encounter (HOSPITAL_COMMUNITY)
Admission: RE | Disposition: A | Discharge status: Home / Self Care | Payer: Self-pay | Source: Home / Self Care | Attending: Cardiovascular Disease

## 2019-10-15 ENCOUNTER — Encounter: Payer: Self-pay | Admitting: Physician Assistant

## 2019-10-15 ENCOUNTER — Encounter (HOSPITAL_COMMUNITY): Payer: Self-pay | Admitting: Cardiovascular Disease

## 2019-10-15 ENCOUNTER — Other Ambulatory Visit: Payer: Self-pay | Admitting: Physician Assistant

## 2019-10-15 ENCOUNTER — Other Ambulatory Visit: Payer: Self-pay

## 2019-10-15 ENCOUNTER — Ambulatory Visit (HOSPITAL_COMMUNITY)
Admission: RE | Admit: 2019-10-15 | Discharge: 2019-10-15 | Disposition: A | Discharge status: Home / Self Care | Payer: Medicare Other | Attending: Cardiovascular Disease | Admitting: Cardiovascular Disease

## 2019-10-15 DIAGNOSIS — Z8249 Family history of ischemic heart disease and other diseases of the circulatory system: Secondary | ICD-10-CM | POA: Insufficient documentation

## 2019-10-15 DIAGNOSIS — N182 Chronic kidney disease, stage 2 (mild): Secondary | ICD-10-CM | POA: Insufficient documentation

## 2019-10-15 DIAGNOSIS — I1310 Hypertensive heart and chronic kidney disease without heart failure, with stage 1 through stage 4 chronic kidney disease, or unspecified chronic kidney disease: Secondary | ICD-10-CM | POA: Insufficient documentation

## 2019-10-15 DIAGNOSIS — Z885 Allergy status to narcotic agent status: Secondary | ICD-10-CM | POA: Insufficient documentation

## 2019-10-15 DIAGNOSIS — K5730 Diverticulosis of large intestine without perforation or abscess without bleeding: Secondary | ICD-10-CM | POA: Diagnosis not present

## 2019-10-15 DIAGNOSIS — I080 Rheumatic disorders of both mitral and aortic valves: Secondary | ICD-10-CM | POA: Diagnosis not present

## 2019-10-15 DIAGNOSIS — K219 Gastro-esophageal reflux disease without esophagitis: Secondary | ICD-10-CM | POA: Diagnosis not present

## 2019-10-15 DIAGNOSIS — I420 Dilated cardiomyopathy: Secondary | ICD-10-CM | POA: Insufficient documentation

## 2019-10-15 DIAGNOSIS — E785 Hyperlipidemia, unspecified: Secondary | ICD-10-CM | POA: Diagnosis not present

## 2019-10-15 DIAGNOSIS — I2510 Atherosclerotic heart disease of native coronary artery without angina pectoris: Secondary | ICD-10-CM | POA: Diagnosis not present

## 2019-10-15 DIAGNOSIS — I350 Nonrheumatic aortic (valve) stenosis: Secondary | ICD-10-CM | POA: Diagnosis not present

## 2019-10-15 DIAGNOSIS — R7303 Prediabetes: Secondary | ICD-10-CM | POA: Insufficient documentation

## 2019-10-15 DIAGNOSIS — I700 Atherosclerosis of aorta: Secondary | ICD-10-CM | POA: Insufficient documentation

## 2019-10-15 DIAGNOSIS — I2584 Coronary atherosclerosis due to calcified coronary lesion: Secondary | ICD-10-CM | POA: Diagnosis not present

## 2019-10-15 DIAGNOSIS — Z888 Allergy status to other drugs, medicaments and biological substances status: Secondary | ICD-10-CM | POA: Insufficient documentation

## 2019-10-15 DIAGNOSIS — I6523 Occlusion and stenosis of bilateral carotid arteries: Secondary | ICD-10-CM | POA: Insufficient documentation

## 2019-10-15 DIAGNOSIS — R918 Other nonspecific abnormal finding of lung field: Secondary | ICD-10-CM | POA: Insufficient documentation

## 2019-10-15 DIAGNOSIS — Z79899 Other long term (current) drug therapy: Secondary | ICD-10-CM | POA: Diagnosis not present

## 2019-10-15 DIAGNOSIS — K862 Cyst of pancreas: Secondary | ICD-10-CM | POA: Diagnosis not present

## 2019-10-15 DIAGNOSIS — Z7982 Long term (current) use of aspirin: Secondary | ICD-10-CM | POA: Diagnosis not present

## 2019-10-15 HISTORY — PX: RIGHT/LEFT HEART CATH AND CORONARY ANGIOGRAPHY: CATH118266

## 2019-10-15 HISTORY — DX: Nonrheumatic aortic (valve) stenosis: I35.0

## 2019-10-15 LAB — POCT I-STAT EG7
Acid-Base Excess: 1 mmol/L (ref 0.0–2.0)
Bicarbonate: 26.4 mmol/L (ref 20.0–28.0)
Calcium, Ion: 1.27 mmol/L (ref 1.15–1.40)
HCT: 34 % — ABNORMAL LOW (ref 36.0–46.0)
Hemoglobin: 11.6 g/dL — ABNORMAL LOW (ref 12.0–15.0)
O2 Saturation: 70 %
Potassium: 3.7 mmol/L (ref 3.5–5.1)
Sodium: 137 mmol/L (ref 135–145)
TCO2: 28 mmol/L (ref 22–32)
pCO2, Ven: 45.4 mmHg (ref 44.0–60.0)
pH, Ven: 7.372 (ref 7.250–7.430)
pO2, Ven: 38 mmHg (ref 32.0–45.0)

## 2019-10-15 LAB — POCT I-STAT 7, (LYTES, BLD GAS, ICA,H+H)
Bicarbonate: 25.7 mmol/L (ref 20.0–28.0)
Calcium, Ion: 1.26 mmol/L (ref 1.15–1.40)
HCT: 34 % — ABNORMAL LOW (ref 36.0–46.0)
Hemoglobin: 11.6 g/dL — ABNORMAL LOW (ref 12.0–15.0)
O2 Saturation: 95 %
Potassium: 3.7 mmol/L (ref 3.5–5.1)
Sodium: 137 mmol/L (ref 135–145)
TCO2: 27 mmol/L (ref 22–32)
pCO2 arterial: 43 mmHg (ref 32.0–48.0)
pH, Arterial: 7.384 (ref 7.350–7.450)
pO2, Arterial: 76 mmHg — ABNORMAL LOW (ref 83.0–108.0)

## 2019-10-15 SURGERY — RIGHT/LEFT HEART CATH AND CORONARY ANGIOGRAPHY
Anesthesia: LOCAL

## 2019-10-15 MED ORDER — SODIUM CHLORIDE 0.9 % IV SOLN: 250.0000 mL | INTRAVENOUS | Status: DC | PRN

## 2019-10-15 MED ORDER — MIDAZOLAM HCL 2 MG/2ML IJ SOLN
INTRAMUSCULAR | Status: AC
  Filled 2019-10-15: qty 2

## 2019-10-15 MED ORDER — SODIUM CHLORIDE 0.9% FLUSH: 3.0000 mL | INTRAVENOUS | Status: DC | PRN

## 2019-10-15 MED ORDER — FENTANYL CITRATE (PF) 100 MCG/2ML IJ SOLN
INTRAMUSCULAR | Status: DC | PRN
  Administered 2019-10-15 (×2): 25 ug via INTRAVENOUS

## 2019-10-15 MED ORDER — LIDOCAINE HCL (PF) 1 % IJ SOLN
INTRAMUSCULAR | Status: AC
  Filled 2019-10-15: qty 30

## 2019-10-15 MED ORDER — ONDANSETRON HCL 4 MG/2ML IJ SOLN: 4.0000 mg | Freq: Four times a day (QID) | INTRAMUSCULAR | Status: DC | PRN

## 2019-10-15 MED ORDER — SODIUM CHLORIDE 0.9% FLUSH: 3.0000 mL | Freq: Two times a day (BID) | INTRAVENOUS | Status: DC

## 2019-10-15 MED ORDER — MIDAZOLAM HCL 2 MG/2ML IJ SOLN
INTRAMUSCULAR | Status: DC | PRN
  Administered 2019-10-15 (×2): 1 mg via INTRAVENOUS

## 2019-10-15 MED ORDER — SODIUM CHLORIDE 0.9 % WEIGHT BASED INFUSION
3.0000 mL/kg/h | INTRAVENOUS | Status: AC
  Administered 2019-10-15: 3 mL/kg/h via INTRAVENOUS

## 2019-10-15 MED ORDER — HEPARIN (PORCINE) IN NACL 1000-0.9 UT/500ML-% IV SOLN
INTRAVENOUS | Status: DC | PRN
  Administered 2019-10-15: 500 mL

## 2019-10-15 MED ORDER — SODIUM CHLORIDE 0.9 % IV SOLN: INTRAVENOUS | Status: AC

## 2019-10-15 MED ORDER — VERAPAMIL HCL 2.5 MG/ML IV SOLN
INTRAVENOUS | Status: AC
  Filled 2019-10-15: qty 2

## 2019-10-15 MED ORDER — HEPARIN (PORCINE) IN NACL 1000-0.9 UT/500ML-% IV SOLN
INTRAVENOUS | Status: AC
  Filled 2019-10-15: qty 1000

## 2019-10-15 MED ORDER — SODIUM CHLORIDE 0.9 % WEIGHT BASED INFUSION: 1.0000 mL/kg/h | INTRAVENOUS | Status: DC

## 2019-10-15 MED ORDER — HYDRALAZINE HCL 20 MG/ML IJ SOLN: 10.0000 mg | INTRAMUSCULAR | Status: DC | PRN

## 2019-10-15 MED ORDER — FENTANYL CITRATE (PF) 100 MCG/2ML IJ SOLN
INTRAMUSCULAR | Status: AC
  Filled 2019-10-15: qty 2

## 2019-10-15 MED ORDER — VERAPAMIL HCL 2.5 MG/ML IV SOLN
INTRAVENOUS | Status: DC | PRN
  Administered 2019-10-15: 10 mL via INTRA_ARTERIAL

## 2019-10-15 MED ORDER — LABETALOL HCL 5 MG/ML IV SOLN: 10.0000 mg | INTRAVENOUS | Status: DC | PRN

## 2019-10-15 MED ORDER — HEPARIN SODIUM (PORCINE) 1000 UNIT/ML IJ SOLN
INTRAMUSCULAR | Status: AC
  Filled 2019-10-15: qty 1

## 2019-10-15 MED ORDER — ACETAMINOPHEN 325 MG PO TABS: 650.0000 mg | ORAL_TABLET | ORAL | Status: DC | PRN

## 2019-10-15 MED ORDER — HEPARIN SODIUM (PORCINE) 1000 UNIT/ML IJ SOLN
INTRAMUSCULAR | Status: DC | PRN
  Administered 2019-10-15: 4000 [IU] via INTRAVENOUS

## 2019-10-15 MED ORDER — ASPIRIN 81 MG PO CHEW
CHEWABLE_TABLET | ORAL | Status: AC
  Administered 2019-10-15: 81 mg via ORAL
  Filled 2019-10-15: qty 1

## 2019-10-15 MED ORDER — LIDOCAINE HCL (PF) 1 % IJ SOLN
INTRAMUSCULAR | Status: DC | PRN
  Administered 2019-10-15 (×2): 3 mL

## 2019-10-15 MED ORDER — ASPIRIN 81 MG PO CHEW: 81.0000 mg | CHEWABLE_TABLET | ORAL | Status: AC

## 2019-10-15 MED ORDER — IOHEXOL 350 MG/ML SOLN
INTRAVENOUS | Status: DC | PRN
  Administered 2019-10-15: 35 mL

## 2019-10-15 SURGICAL SUPPLY — 14 items
CATH 5FR JL3.5 JR4 ANG PIG MP (CATHETERS) ×1 IMPLANT
CATH BALLN WEDGE 5F 110CM (CATHETERS) ×1 IMPLANT
CATH INFINITI 5FR AL1 (CATHETERS) ×1 IMPLANT
DEVICE RAD COMP TR BAND LRG (VASCULAR PRODUCTS) ×1 IMPLANT
GLIDESHEATH SLEND SS 6F .021 (SHEATH) ×1 IMPLANT
GUIDEWIRE .025 260CM (WIRE) ×1 IMPLANT
GUIDEWIRE INQWIRE 1.5J.035X260 (WIRE) IMPLANT
INQWIRE 1.5J .035X260CM (WIRE) ×2
KIT HEART LEFT (KITS) ×2 IMPLANT
PACK CARDIAC CATHETERIZATION (CUSTOM PROCEDURE TRAY) ×2 IMPLANT
SHEATH GLIDE SLENDER 4/5FR (SHEATH) ×1 IMPLANT
TRANSDUCER W/STOPCOCK (MISCELLANEOUS) ×2 IMPLANT
TUBING CIL FLEX 10 FLL-RA (TUBING) ×2 IMPLANT
WIRE EMERALD ST .035X150CM (WIRE) ×1 IMPLANT

## 2019-10-15 NOTE — Discharge Instructions (Signed)
Radial Site Care ° °This sheet gives you information about how to care for yourself after your procedure. Your health care provider may also give you more specific instructions. If you have problems or questions, contact your health care provider. °What can I expect after the procedure? °After the procedure, it is common to have: °· Bruising and tenderness at the catheter insertion area. °Follow these instructions at home: °Medicines °· Take over-the-counter and prescription medicines only as told by your health care provider. °Insertion site care °· Follow instructions from your health care provider about how to take care of your insertion site. Make sure you: °? Wash your hands with soap and water before you change your bandage (dressing). If soap and water are not available, use hand sanitizer. °? Change your dressing as told by your health care provider. °? Leave stitches (sutures), skin glue, or adhesive strips in place. These skin closures may need to stay in place for 2 weeks or longer. If adhesive strip edges start to loosen and curl up, you may trim the loose edges. Do not remove adhesive strips completely unless your health care provider tells you to do that. °· Check your insertion site every day for signs of infection. Check for: °? Redness, swelling, or pain. °? Fluid or blood. °? Pus or a bad smell. °? Warmth. °· Do not take baths, swim, or use a hot tub until your health care provider approves. °· You may shower 24-48 hours after the procedure, or as directed by your health care provider. °? Remove the dressing and gently wash the site with plain soap and water. °? Pat the area dry with a clean towel. °? Do not rub the site. That could cause bleeding. °· Do not apply powder or lotion to the site. °Activity ° °· For 24 hours after the procedure, or as directed by your health care provider: °? Do not flex or bend the affected arm. °? Do not push or pull heavy objects with the affected arm. °? Do not  drive yourself home from the hospital or clinic. You may drive 24 hours after the procedure unless your health care provider tells you not to. °? Do not operate machinery or power tools. °· Do not lift anything that is heavier than 10 lb (4.5 kg), or the limit that you are told, until your health care provider says that it is safe. °· Ask your health care provider when it is okay to: °? Return to work or school. °? Resume usual physical activities or sports. °? Resume sexual activity. °General instructions °· If the catheter site starts to bleed, raise your arm and put firm pressure on the site. If the bleeding does not stop, get help right away. This is a medical emergency. °· If you went home on the same day as your procedure, a responsible adult should be with you for the first 24 hours after you arrive home. °· Keep all follow-up visits as told by your health care provider. This is important. °Contact a health care provider if: °· You have a fever. °· You have redness, swelling, or yellow drainage around your insertion site. °Get help right away if: °· You have unusual pain at the radial site. °· The catheter insertion area swells very fast. °· The insertion area is bleeding, and the bleeding does not stop when you hold steady pressure on the area. °· Your arm or hand becomes pale, cool, tingly, or numb. °These symptoms may represent a serious problem   that is an emergency. Do not wait to see if the symptoms will go away. Get medical help right away. Call your local emergency services (911 in the [place] Do not drive yourself to the hospital. Summary  After the procedure, it is common to have bruising and tenderness at the site.  Follow instructions from your health care provider about how to take care of your radial site wound. Check the wound every day for signs of infection.  Do not lift anything that is heavier than 10 lb (4.5 kg), or the limit that you are told, until your health care provider says  that it is safe. This information is not intended to replace advice given to you by your health care provider. Make sure you discuss any questions you have with your health care provider. Document Revised: 08/10/2017 Document Reviewed: 08/10/2017 Elsevier Patient Education  [place]. Increase HCTZ to 25 mg once daily. If no resolution of lower extremity swelling over the next three days, please call Dr. Bettina Gavia to have him start Lasix.

## 2019-10-15 NOTE — Interval H&P Note (Signed)
History and Physical Interval Note:  10/15/2019 7:37 AM  Hannah Briggs  has presented today for surgery, with the diagnosis of chest pain.  The various methods of treatment have been discussed with the patient and family. After consideration of risks, benefits and other options for treatment, the patient has consented to  Procedure(s): RIGHT/LEFT HEART CATH AND CORONARY ANGIOGRAPHY (N/A) as a surgical intervention.  The patient's history has been reviewed, patient examined, no change in status, stable for surgery.  I have reviewed the patient's chart and labs.  Questions were answered to the patient's satisfaction.    Cath Lab Visit (complete for each Cath Lab visit)  Clinical Evaluation Leading to the Procedure:   ACS: No.  Non-ACS:    Anginal Classification: CCS II  Anti-ischemic medical therapy: Minimal Therapy (1 class of medications)  Non-Invasive Test Results: No non-invasive testing performed  Prior CABG: No previous CABG        Lauree Chandler

## 2019-10-15 NOTE — Progress Notes (Signed)
Discharge instructions reviewed with patient and family. Verbalized understanding. 

## 2019-10-16 ENCOUNTER — Encounter: Payer: Self-pay | Admitting: Cardiology

## 2019-10-17 ENCOUNTER — Other Ambulatory Visit (HOSPITAL_COMMUNITY): Payer: Self-pay | Admitting: *Deleted

## 2019-10-18 ENCOUNTER — Encounter (HOSPITAL_COMMUNITY)
Admission: RE | Admit: 2019-10-18 | Discharge: 2019-10-18 | Disposition: A | Discharge status: Home / Self Care | Payer: Medicare Other | Source: Ambulatory Visit | Attending: Cardiovascular Disease | Admitting: Cardiovascular Disease

## 2019-10-18 ENCOUNTER — Ambulatory Visit (HOSPITAL_COMMUNITY)
Admission: RE | Admit: 2019-10-18 | Discharge: 2019-10-18 | Disposition: A | Discharge status: Home / Self Care | Payer: Medicare Other | Source: Home / Self Care | Attending: Physician Assistant | Admitting: Physician Assistant

## 2019-10-18 ENCOUNTER — Ambulatory Visit (HOSPITAL_BASED_OUTPATIENT_CLINIC_OR_DEPARTMENT_OTHER)
Admission: RE | Admit: 2019-10-18 | Discharge: 2019-10-18 | Disposition: A | Discharge status: Home / Self Care | Payer: Medicare Other | Source: Home / Self Care | Attending: Physician Assistant | Admitting: Physician Assistant

## 2019-10-18 ENCOUNTER — Other Ambulatory Visit: Payer: Self-pay

## 2019-10-18 ENCOUNTER — Encounter: Payer: Self-pay | Admitting: Physical Therapy

## 2019-10-18 ENCOUNTER — Ambulatory Visit: Payer: Medicare Other | Attending: Physician Assistant | Admitting: Physical Therapy

## 2019-10-18 DIAGNOSIS — R7303 Prediabetes: Secondary | ICD-10-CM | POA: Diagnosis not present

## 2019-10-18 DIAGNOSIS — E785 Hyperlipidemia, unspecified: Secondary | ICD-10-CM | POA: Diagnosis not present

## 2019-10-18 DIAGNOSIS — K219 Gastro-esophageal reflux disease without esophagitis: Secondary | ICD-10-CM | POA: Diagnosis not present

## 2019-10-18 DIAGNOSIS — I350 Nonrheumatic aortic (valve) stenosis: Secondary | ICD-10-CM

## 2019-10-18 DIAGNOSIS — Z888 Allergy status to other drugs, medicaments and biological substances status: Secondary | ICD-10-CM | POA: Diagnosis not present

## 2019-10-18 DIAGNOSIS — Z885 Allergy status to narcotic agent status: Secondary | ICD-10-CM | POA: Diagnosis not present

## 2019-10-18 DIAGNOSIS — Z79899 Other long term (current) drug therapy: Secondary | ICD-10-CM | POA: Diagnosis not present

## 2019-10-18 DIAGNOSIS — Z7982 Long term (current) use of aspirin: Secondary | ICD-10-CM | POA: Diagnosis not present

## 2019-10-18 DIAGNOSIS — Z8249 Family history of ischemic heart disease and other diseases of the circulatory system: Secondary | ICD-10-CM | POA: Diagnosis not present

## 2019-10-18 DIAGNOSIS — I1310 Hypertensive heart and chronic kidney disease without heart failure, with stage 1 through stage 4 chronic kidney disease, or unspecified chronic kidney disease: Secondary | ICD-10-CM | POA: Diagnosis not present

## 2019-10-18 DIAGNOSIS — R262 Difficulty in walking, not elsewhere classified: Secondary | ICD-10-CM | POA: Insufficient documentation

## 2019-10-18 DIAGNOSIS — I420 Dilated cardiomyopathy: Secondary | ICD-10-CM | POA: Diagnosis not present

## 2019-10-18 DIAGNOSIS — I080 Rheumatic disorders of both mitral and aortic valves: Secondary | ICD-10-CM | POA: Diagnosis not present

## 2019-10-18 DIAGNOSIS — I2510 Atherosclerotic heart disease of native coronary artery without angina pectoris: Secondary | ICD-10-CM | POA: Diagnosis not present

## 2019-10-18 DIAGNOSIS — N182 Chronic kidney disease, stage 2 (mild): Secondary | ICD-10-CM | POA: Diagnosis not present

## 2019-10-18 DIAGNOSIS — N183 Chronic kidney disease, stage 3 unspecified: Secondary | ICD-10-CM | POA: Diagnosis not present

## 2019-10-18 DIAGNOSIS — I2584 Coronary atherosclerosis due to calcified coronary lesion: Secondary | ICD-10-CM | POA: Diagnosis not present

## 2019-10-18 DIAGNOSIS — I700 Atherosclerosis of aorta: Secondary | ICD-10-CM | POA: Diagnosis not present

## 2019-10-18 DIAGNOSIS — R918 Other nonspecific abnormal finding of lung field: Secondary | ICD-10-CM | POA: Diagnosis not present

## 2019-10-18 DIAGNOSIS — K5730 Diverticulosis of large intestine without perforation or abscess without bleeding: Secondary | ICD-10-CM | POA: Diagnosis not present

## 2019-10-18 DIAGNOSIS — I6523 Occlusion and stenosis of bilateral carotid arteries: Secondary | ICD-10-CM | POA: Diagnosis not present

## 2019-10-18 DIAGNOSIS — K862 Cyst of pancreas: Secondary | ICD-10-CM | POA: Diagnosis not present

## 2019-10-18 LAB — BASIC METABOLIC PANEL
Anion gap: 14 (ref 5–15)
BUN: 15 mg/dL (ref 8–23)
CO2: 26 mmol/L (ref 22–32)
Calcium: 10.2 mg/dL (ref 8.9–10.3)
Chloride: 94 mmol/L — ABNORMAL LOW (ref 98–111)
Creatinine, Ser: 1.09 mg/dL — ABNORMAL HIGH (ref 0.44–1.00)
GFR calc Af Amer: 54 mL/min — ABNORMAL LOW (ref >60)
GFR calc non Af Amer: 46 mL/min — ABNORMAL LOW (ref >60)
Glucose, Bld: 125 mg/dL — ABNORMAL HIGH (ref 70–99)
Potassium: 4.1 mmol/L (ref 3.5–5.1)
Sodium: 134 mmol/L — ABNORMAL LOW (ref 135–145)

## 2019-10-18 MED ORDER — SODIUM CHLORIDE 0.9 % WEIGHT BASED INFUSION
3.0000 mL/kg/h | INTRAVENOUS | Status: AC
  Administered 2019-10-18: 09:00:00 3 mL/kg/h via INTRAVENOUS

## 2019-10-18 MED ORDER — METOPROLOL TARTRATE 5 MG/5ML IV SOLN
5.0000 mg | INTRAVENOUS | Status: DC | PRN
  Administered 2019-10-18: 11:00:00 5 mg via INTRAVENOUS

## 2019-10-18 MED ORDER — METOPROLOL TARTRATE 5 MG/5ML IV SOLN
INTRAVENOUS | Status: AC
  Filled 2019-10-18: qty 10

## 2019-10-18 MED ORDER — SODIUM CHLORIDE 0.9 % WEIGHT BASED INFUSION: 1.0000 mL/kg/h | INTRAVENOUS | Status: DC

## 2019-10-18 MED ORDER — IOHEXOL 350 MG/ML SOLN
100.0000 mL | Freq: Once | INTRAVENOUS | Status: AC | PRN
  Administered 2019-10-18: 100 mL via INTRAVENOUS

## 2019-10-18 NOTE — Progress Notes (Signed)
Carotid artery duplex completed. Refer to "CV Proc" under chart review to view preliminary results.  10/18/2019 8:53 AM Kelby Aline., MHA, RVT, RDCS, RDMS

## 2019-10-18 NOTE — Therapy (Signed)
[place], [place], [address] Phone: [phone]   Fax:  [phone]  Physical Therapy Evaluation  Patient Details  Name: Hannah Briggs MRN: [id number] Date of Birth: 09-15-1931 Referring Provider (PT): Sherren Mocha, MD   Encounter Date: 10/18/2019  PT End of Session - 10/18/19 1328    Visit Number  1    PT Start Time  1320    PT Stop Time  7564    PT Time Calculation (min)  35 min    Activity Tolerance  Patient tolerated treatment well    Behavior During Therapy  [facility] for tasks assessed/performed       Past Medical History:  Diagnosis Date  . CKD (chronic kidney disease) 03/08/2017  . Dyslipidemia 03/08/2017   Statin intolerance  . GERD (gastroesophageal reflux disease) 03/08/2017  . Hemoptysis 04/04/2018  . Hypertensive heart disease 03/08/2017  . Prediabetes 03/08/2017  . Pure hypercholesterolemia 04/08/2015   Overview:  Intolerant of pravastatin and simvastatin  . Rectal cancer ([place])   . Severe aortic stenosis   . Vitamin D deficiency 03/08/2017    Past Surgical History:  Procedure Laterality Date  . APPENDECTOMY    . BLADDER SURGERY    . BREAST BIOPSY    . CESAREAN SECTION    . CHOLECYSTECTOMY    . COLON SURGERY    . EUS N/A 02/24/2017   Procedure: LOWER ENDOSCOPIC ULTRASOUND (EUS);  Surgeon: Milus Banister, MD;  Location: [facility];  Service: Endoscopy;  Laterality: N/A;  . RIGHT/LEFT HEART CATH AND CORONARY ANGIOGRAPHY N/A 10/15/2019   Procedure: RIGHT/LEFT HEART CATH AND CORONARY ANGIOGRAPHY;  Surgeon: Burnell Blanks, MD;  Location: [facility];  Service: Cardiovascular;  Laterality: N/A;  . TONSILLECTOMY      There were no vitals filed for this visit.   Subjective Assessment - 10/18/19 1325    Subjective  Shortness of breath with walking. I have to sit just carrying lunch to the car. This all began a couple of years ago and has gradually gotten worse. To get my paper  in the morning I climb 9 steps and it takes about 30 steps to get there. I have back pain if I have been up for a while but it goes away when I rest.    How long can you walk comfortably?  a couple of hours    Currently in Pain?  No/denies         [facility] PT Assessment - 10/18/19 0001      Assessment   Medical Diagnosis  Severe aortic stenosis    Referring Provider (PT)  Sherren Mocha, MD    Onset Date/Surgical Date  --   a few years ago   Hand Dominance  Right    Prior Therapy  no      Precautions   Precautions  None      Restrictions   Weight Bearing Restrictions  No      Balance Screen   Has the patient fallen in the past 6 months  No      [place] residence    Living Arrangements  Alone    Additional Comments  9 steps outside of home      Prior Function   Level of [place]  Retired      Associate Professor   Overall Cognitive Status  Within Functional Limits for tasks assessed  Sensation   Additional Comments  [facility]      Posture/Postural Control   Posture Comments  forward head, rounded shoulders, incr kyphosis      ROM / Strength   AROM / PROM / Strength  AROM;Strength      AROM   Overall AROM Comments  WFL      Strength   Overall Strength Comments  WFL    Strength Assessment Site  Hand    Right/Left hand  Right;Left    Right Hand Grip (lbs)  40    Left Hand Grip (lbs)  33       [facility] Pre-Surgical Assessment - 10/18/19 0001    5 Meter Walk Test- trial 1  3 sec    5 Meter Walk Test- trial 2  3 sec.     5 Meter Walk Test- trial 3  3 sec.    5 meter walk test average  3 sec    4 Stage Balance Test Position  2    Sit To Stand Test- trial 1  11 sec.    6 Minute Walk- Baseline  yes    BP (mmHg)  145/65    HR (bpm)  92    02 Sat (%RA)  97 %    Modified Borg Scale for Dyspnea  0- Nothing at all    Perceived Rate of Exertion (Borg)  6-    6 Minute Walk Post Test  yes    BP (mmHg)  148/80     HR (bpm)  108    02 Sat (%RA)  96 %    Modified Borg Scale for Dyspnea  6-    Perceived Rate of Exertion (Borg)  15- Hard    Aerobic Endurance Distance Walked  1085    Endurance additional comments  15% disability compared to age related norms              Objective measurements completed on examination: See above findings.                           Plan - 10/18/19 1359    PT Frequency  One time visit       Clinical Impression Statement: Pt is a 86 yo F presenting to OP PT for evaluation prior to possible TAVR surgery due to severe aortic stenosis. Pt reports onset of SOB a few years ago that has progressively worsened. Symptoms are  limiting functional ambulation distance and household chores. Pt presents with WFL ROM and strength and denies musculoskeletal pain at this time. Does report a history of back pain that has been treated through PT.  Pt ambulated a total of 1085 feet in 6 minute walk and reported 6/10 SOB on modified scale for dyspena and 15/20 RPE on Borg's perceived exertion and pain scale at the end of the walk. During the 6 minute walk test, patient's HR increased to 120 BPM and O2 saturation decreased to 91%. Based on the Short Physical Performance Battery, patient has a frailty rating of 10/12 with </= 5/12 considered frail.  Visit Diagnosis: Difficulty in walking, not elsewhere classified     Problem List Patient Active Problem List   Diagnosis Date Noted  . Severe aortic stenosis   . Hemoptysis 04/04/2018  . Preoperative cardiovascular examination 03/10/2017  . Aortic stenosis 03/09/2017  . CKD (chronic kidney disease) 03/08/2017  . Dyslipidemia 03/08/2017  . Hypertensive heart disease 03/08/2017  . Heart murmur 03/08/2017  .  Prediabetes 03/08/2017  . GERD (gastroesophageal reflux disease) 03/08/2017  . Vitamin D deficiency 03/08/2017  . Rectal cancer ([place])     Lisbet Busker C. Nylee Barbuto PT, DPT 10/18/19 2:01 PM   [facility] [address] [place], [place], [address] Phone: [phone]   Fax:  [phone]  Name: Hannah Briggs MRN: [id number] Date of Birth: 23-Nov-1931

## 2019-10-23 ENCOUNTER — Encounter: Payer: Self-pay | Admitting: Physician Assistant

## 2019-10-24 ENCOUNTER — Other Ambulatory Visit: Payer: Self-pay

## 2019-10-24 ENCOUNTER — Encounter: Payer: Self-pay | Admitting: Surgery

## 2019-10-24 ENCOUNTER — Institutional Professional Consult (permissible substitution): Payer: Medicare Other | Admitting: Surgery

## 2019-10-24 ENCOUNTER — Encounter: Payer: Self-pay | Admitting: Physician Assistant

## 2019-10-24 VITALS — BP 160/74 | HR 100 | Temp 97.8°F | Resp 20 | Ht <= 58 in | Wt 155.0 lb

## 2019-10-24 DIAGNOSIS — I350 Nonrheumatic aortic (valve) stenosis: Secondary | ICD-10-CM

## 2019-10-24 NOTE — Progress Notes (Signed)
Patient ID: AMBRI MILTNER, female   DOB: Nov 23, 1931, 86 y.o.   MRN: [id number]  [facility] CONSULTATION REPORT  Referring Provider is Richardo Priest, MD Primary Cardiologist is No primary care provider on file. PCP is Renaldo Reel, PA  Chief Complaint  Patient presents with   Aortic Stenosis    Surgical eval for TAVR, review all studies/testing    HPI:  The patient is an 86 year old woman with a history of hypertension, hyperlipidemia, stage III chronic kidney disease, prediabetes, and aortic stenosis dating back to 2016 when it was noted to be mild with a mean gradient of 13.5 mmHg.  She has had a progressive increase in the mean gradient on subsequent echoes and had an echo on 02/21/2019 showing increase in the mean gradient to 39.5 mmHg with a peak gradient of 66.3 mmHg.  Her echocardiogram also showed a degenerative mitral valve with moderate mitral annular calcification and a mean mitral valve gradient of 13.5 mmHg with a peak gradient of 26.3 mmHg.  Valve area was 2.91 cm by pressure half-time.  She now presents with a several month history of progressive exertional fatigue and shortness of breath which she feels has significantly worsened over the past month or so.  She is able to do her ADLs in the house but has to move slowly and take rest breaks.  She had a follow-up echocardiogram on 08/29/2019 which showed an increase in the mean aortic valve gradient to 48 mmHg with a peak gradient of 79 mmHg.  Aortic valve area was measured at 0.82 cm by VTI.  Dimensionless index was 0.26.  The mitral valve mean gradient had also increased to 17.5 mmHg with a peak gradient of 30 mmHg.  Valve area by pressure half-time is 2.73 cm.  There is no mitral valve regurgitation.    Past Medical History:  Diagnosis Date   CKD (chronic kidney disease) 03/08/2017   GERD (gastroesophageal reflux disease) 03/08/2017   Hemoptysis  04/04/2018   Hypertensive heart disease 03/08/2017   Prediabetes 03/08/2017   Pure hypercholesterolemia 04/08/2015   Overview:  Intolerant of pravastatin and simvastatin   Rectal cancer (HCC)    Severe aortic stenosis    Vitamin D deficiency 03/08/2017    Past Surgical History:  Procedure Laterality Date   APPENDECTOMY     BLADDER SURGERY     BREAST BIOPSY     CESAREAN SECTION     CHOLECYSTECTOMY     COLON SURGERY     EUS N/A 02/24/2017   Procedure: LOWER ENDOSCOPIC ULTRASOUND (EUS);  Surgeon: Milus Banister, MD;  Location: [facility];  Service: Endoscopy;  Laterality: N/A;   RIGHT/LEFT HEART CATH AND CORONARY ANGIOGRAPHY N/A 10/15/2019   Procedure: RIGHT/LEFT HEART CATH AND CORONARY ANGIOGRAPHY;  Surgeon: Burnell Blanks, MD;  Location: [facility];  Service: Cardiovascular;  Laterality: N/A;   TONSILLECTOMY      Family History  Problem Relation Age of Onset   Heart attack Mother    Congestive Heart Failure Father    Parkinson's disease Sister    Heart disease Sister    AAA (abdominal aortic aneurysm) Brother     Social History   Socioeconomic History   Marital status: Widowed    Spouse name: Not on file   Number of children: Not on file   Years of education: Not on file   Highest education level: Not on file  Occupational History  Not on file  Tobacco Use   Smoking status: Never Smoker   Smokeless tobacco: Never Used  Substance and Sexual Activity   Alcohol use: No   Drug use: No   Sexual activity: Not on file  Other Topics Concern   Not on file  Social History Narrative   Not on file   Social Determinants of Health   Financial Resource Strain:    Difficulty of Paying Living Expenses:   Food Insecurity:    Worried About Charity fundraiser in the Last Year:    Arboriculturist in the Last Year:   Transportation Needs:    Film/video editor (Medical):    Lack of Transportation (Non-Medical):     Physical Activity:    Days of Exercise per Week:    Minutes of Exercise per Session:   Stress:    Feeling of Stress :   Social Connections:    Frequency of Communication with Friends and Family:    Frequency of Social Gatherings with Friends and Family:    Attends Religious Services:    Active Member of Clubs or Organizations:    Attends Music therapist:    Marital Status:   Intimate Partner Violence:    Fear of Current or Ex-Partner:    Emotionally Abused:    Physically Abused:    Sexually Abused:     Current Outpatient Medications  Medication Sig Dispense Refill   aluminum hydroxide-magnesium carbonate (GAVISCON) 95-358 MG/15ML SUSP Take 30 mLs by mouth at bedtime. Hiatal hernia     amLODipine (NORVASC) 10 MG tablet Take 10 mg by mouth daily.      aspirin EC 81 MG tablet Take 81 mg by mouth daily.     benazepril (LOTENSIN) 40 MG tablet Take 40 mg by mouth daily.     Calcium Carb-Cholecalciferol (CALCIUM 600+D3 PO) Take 1 tablet by mouth daily.     calcium carbonate (TUMS EX) 750 MG chewable tablet Chew 1 tablet by mouth 4 (four) times daily as needed for heartburn.      cholecalciferol (VITAMIN D) 1000 units tablet Take 1,000 Units by mouth daily.     Cholecalciferol (VITAMIN D-3) 125 MCG (5000 UT) TABS Take 5,000 Units by mouth daily.     Coenzyme Q10 (COQ10) 100 MG CAPS Take 100 mg by mouth at bedtime.     ezetimibe (ZETIA) 10 MG tablet Take 10 mg by mouth every evening.     fenofibrate (TRICOR) 145 MG tablet Take 145 mg by mouth every Tuesday, Thursday, Saturday, and Sunday at 6 PM.      ferrous gluconate (IRON 27) 240 (27 FE) MG tablet Take 27 mg by mouth daily.     hydrochlorothiazide (MICROZIDE) 12.5 MG capsule Take 12.5 mg by mouth daily.     montelukast (SINGULAIR) 10 MG tablet Take 10 mg by mouth daily as needed (sinus drainage).      Multiple Vitamin (MULTIVITAMIN WITH MINERALS) TABS tablet Take 1 tablet by mouth daily.      nystatin cream (MYCOSTATIN) Apply 1 application topically 2 (two) times daily as needed (irritation).     omeprazole (PRILOSEC) 20 MG capsule Take 20 mg by mouth daily before breakfast.      OVER THE COUNTER MEDICATION Take 1,500-3,000 mg by mouth See admin instructions. (D-mannose) Take 1 capsule (1500 mg) by mouth in the morning & take 2 capsules (3000 mg) by mouth at night.     Potassium 99 MG TABS Take 99 mg  by mouth daily.     pravastatin (PRAVACHOL) 20 MG tablet Take 40 mg by mouth every Monday, Wednesday, and Friday. At night      Probiotic Product (PROBIOTIC PO) Take 1 capsule by mouth daily.     tiZANidine (ZANAFLEX) 2 MG tablet Take 2 mg by mouth at bedtime as needed for muscle spasms.      Turmeric 500 MG CAPS Take 500 mg by mouth daily.      Wheat Dextrin (BENEFIBER DRINK MIX) PACK Take 1 Package by mouth every other day as needed (constipation.).      No current facility-administered medications for this visit.    Allergies  Allergen Reactions   Claritin-D 12 Hour [Loratadine-Pseudoephedrine Er]     Pt was taking a 12 day course when she stopped course pt starting have rapid heart beat    Codeine     "climb the wall"   Lisinopril     Swelling of tongue    Statins     Muscle and joint pain      Review of Systems:   General:  normal appetite, + decreased energy, no weight gain, no weight loss, no fever  Cardiac:  no chest pain with exertion, no chest pain at rest, +SOB with mild exertion, no resting SOB, no PND, no orthopnea, no palpitations, no arrhythmia, no atrial fibrillation, + LE edema, + positional dizzy spells, no syncope  Respiratory:  + exertional shortness of breath, no home oxygen, no productive cough, no dry cough, no bronchitis, no wheezing, no hemoptysis, no asthma, no pain with inspiration or cough, no sleep apnea, no CPAP at night  GI:   no difficulty swallowing, + reflux, no frequent heartburn, + hiatal hernia, no abdominal pain, no  constipation, no diarrhea, no hematochezia, no hematemesis, no melena  GU:   no dysuria,  no frequency, no urinary tract infection, no hematuria, no kidney stones, no kidney disease  Vascular:  no pain suggestive of claudication, no pain in feet, no leg cramps, no varicose veins, no DVT, no non-healing foot ulcer  Neuro:   no stroke, no TIA's, no seizures, no headaches, no temporary blindness one eye,  no slurred speech, no peripheral neuropathy, no chronic pain, no instability of gait, no memory/cognitive dysfunction  Musculoskeletal: no arthritis, no joint swelling, no myalgias, no difficulty walking, normal mobility   Skin:   no rash, no itching, no skin infections, no pressure sores or ulcerations  Psych:   no anxiety, no depression, no nervousness, no unusual recent stress  Eyes:   no blurry vision, no floaters, no recent vision changes, + wears glasses   ENT:   no hearing loss, no loose or painful teeth  Hematologic:  no easy bruising, no abnormal bleeding, no clotting disorder, no frequent epistaxis  Endocrine:  no diabetes, does not check CBG's at home     Physical Exam:   BP (!) 160/74    Pulse 100    Temp 97.8 F (36.6 C) (Skin)    Resp 20    Ht 4\' 9"  (1.448 m)    Wt 155 lb (70.3 kg)    SpO2 94% Comment: RA   BMI 33.54 kg/m   General:  Elderly but  well-appearing  HEENT:  Unremarkable, NCAT, PERLA, EOMI,   Neck:   no JVD, no bruits, no adenopathy   Chest:   clear to auscultation, symmetrical breath sounds, no wheezes, no rhonchi   CV:   RRR, grade lll/VI crescendo/decrescendo murmur heard best at  RSB,  no diastolic murmur  Abdomen:  soft, non-tender, no masses   Extremities:  warm, well-perfused, pulses palpable at ankle, no LE edema  Rectal/GU  Deferred  Neuro:   Grossly non-focal and symmetrical throughout  Skin:   Clean and dry, no rashes, no breakdown   Diagnostic Tests:  ECHOCARDIOGRAM REPORT       Patient Name:  ARIEA ROCHIN Date of Exam: 08/29/2019  Medical  Rec #: [id number]   Height:    59.0 in  Accession #:  [id number]  Weight:    153.6 lb  Date of Birth: 01/06/1932   BSA:     1.65 m  Patient Age:  27 years   BP:      144/80 mmHg  Patient Gender: F       HR:      97 bpm.  Exam Location: [facility]   Procedure: 2D Echo   Indications:  Nonrheumatic aortic valve stenosis [I35.0] - Primary    History:    Patient has no prior history of Echocardiogram  examinations.    Sonographer:  Luane School  Referring Phys: [phone] [facility]    1. Left ventricular ejection fraction, by estimation, is 65 to 70%. The  left ventricle has normal function. The left ventrical has no regional  wall motion abnormalities. There is moderately increased left ventricular  hypertrophy. Left ventricular  diastolic function could not be evaluated.  2. Left atrial size was moderately dilated.  3. MS visually appears to be at least moderate, however MVA by P1/2 is  only 2.72 cm2. Gradent accross MV significantly elevated. Consider TEE for  better assessment of MS. The mitral valve is normal in structure and  function. no evidence of mitral valve  regurgitation. Mild to moderate mitral stenosis.  4. Aortic valve regurgitation is not visualized. Severe aortic valve  stenosis.   Progression of the AS as compared to study from 02/21/2019. Peak/mean  gradient was 66/39 mmHg, DI was .29. Now Peak/mean gradient 79/48 mmHg,  AVA 08 cm2, DI 0.26   FINDINGS  Left Ventricle: Left ventricular ejection fraction, by estimation, is 65  to 70%. The left ventricle has normal function. The left ventricle has no  regional wall motion abnormalities. The left ventricular internal cavity  size was normal in size. There is  moderately increased left ventricular hypertrophy. Left ventricular  diastolic function could not be evaluated.   Right Ventricle: The right ventricular size is normal. No increase in    right ventricular wall thickness. Right ventricular systolic function is  normal. There is mildly elevated pulmonary artery systolic pressure. The  tricuspid regurgitant velocity is 2.65  m/s, and with an assumed right atrial pressure of 3 mmHg, the estimated  right ventricular systolic pressure is 73.5 mmHg.   Left Atrium: Left atrial size was moderately dilated.   Right Atrium: Right atrial size was normal in size.   Pericardium: There is no evidence of pericardial effusion.   Mitral Valve: MS visually appears to be at least moderate, however MVA by  P1/2 is only 2.72 cm2. Gradent accross MV significantly elevated. Consider  TEE for better assessment of MS. The mitral valve is normal in structure  and function. There is moderate  calcification of the mitral valve leaflet(s). Moderately decreased  mobility of the mitral valve leaflets. Moderate mitral annular  calcification. No evidence of mitral valve regurgitation. Mild to moderate  mitral valve stenosis. MV peak gradient, 30.0  mmHg. The mean mitral  valve gradient is 17.5 mmHg.   Tricuspid Valve: The tricuspid valve is normal in structure. Tricuspid  valve regurgitation is mild . No evidence of tricuspid stenosis.   Aortic Valve: Progression of the AS as compared to study from 02/21/2019.  Peak/mean gradient was 66/39 mmHg, DI was .29. Now Peak/mean gradient  79/48 mmHg, AVA 08 cm2, DI 0.26.  The aortic valve is normal in structure and function. Aortic valve  regurgitation is not visualized. Severe aortic stenosis is present. Aortic  valve mean gradient measures 48.0 mmHg. Aortic valve peak gradient  measures 78.9 mmHg. Aortic valve area, by VTI  measures 0.82 cm.   Pulmonic Valve: The pulmonic valve was normal in structure. Pulmonic valve  regurgitation is not visualized. No evidence of pulmonic stenosis.   Aorta: The aortic root is normal in size and structure.   Venous: The inferior vena cava is normal in size with  greater than 50%  respiratory variability, suggesting right atrial pressure of 3 mmHg. The  inferior vena cava and the hepatic vein show a normal flow pattern.   IAS/Shunts: No atrial level shunt detected by color flow Doppler.     LEFT VENTRICLE  PLAX 2D  LVIDd:     3.70 cm Diastology  LVIDs:     2.60 cm LV e' lateral:  4.24 cm/s  LV PW:     1.60 cm LV E/e' lateral: 58.3  LV IVS:    1.70 cm LV e' medial:  4.13 cm/s  LVOT diam:   2.00 cm LV E/e' medial: 59.8  LV SV:     71.63 ml  LV SV Index:  19.35  LVOT Area:   3.14 cm     RIGHT VENTRICLE       IVC  RV S prime:   18.20 cm/s IVC diam: 1.40 cm  TAPSE (M-mode): 2.5 cm   LEFT ATRIUM       Index    RIGHT ATRIUM      Index  LA diam:    4.40 cm 2.67 cm/m RA Area:   13.30 cm  LA Vol (A2C):  111.0 ml 67.33 ml/m RA Volume:  28.30 ml 17.17 ml/m  LA Vol (A4C):  56.1 ml 34.03 ml/m  LA Biplane Vol: 81.0 ml 49.13 ml/m  AORTIC VALVE  AV Area (Vmax):  0.82 cm  AV Area (Vmean):  0.79 cm  AV Area (VTI):   0.82 cm  AV Vmax:      444.00 cm/s  AV Vmean:     325.000 cm/s  AV VTI:      0.878 m  AV Peak Grad:   78.9 mmHg  AV Mean Grad:   48.0 mmHg  LVOT Vmax:     116.00 cm/s  LVOT Vmean:    81.800 cm/s  LVOT VTI:     0.228 m  LVOT/AV VTI ratio: 0.26    AORTA  Ao Root diam: 2.60 cm  Ao Asc diam: 3.10 cm   MITRAL VALVE             TRICUSPID VALVE  MV Area (PHT): 2.73 cm       TR Peak grad:  28.1 mmHg  MV Peak grad: 30.0 mmHg       TR Vmax:    265.00 cm/s  MV Mean grad: 17.5 mmHg  MV Vmax:    2.74 m/s       SHUNTS  MV Vmean:   194.0 cm/s      Systemic VTI: 0.23 m  MV  Decel Time: 278 msec       Systemic Diam: 2.00 cm  MV E velocity: 247.00 cm/s 103 cm/s  MV A velocity: 213.00 cm/s 70.3 cm/s  MV E/A ratio: 1.16    1.5   Jenne Campus MD    Electronically signed by Jenne Campus MD  Signature Date/Time: 08/30/2019/7:05:32 PM     Physicians Panel Physicians Referring Physician Case Authorizing Physician  Burnell Blanks, MD (Primary)       Procedures RIGHT/LEFT HEART CATH AND CORONARY ANGIOGRAPHY     Conclusion Prox RCA lesion is 10% stenosed.  Dist RCA lesion is 10% stenosed.  Ost LM to Mid LM lesion is 10% stenosed.  Prox Cx lesion is 10% stenosed.  Mid LAD lesion is 20% stenosed. 1. Mild non-obstructive CAD  2. Elevated wedge pressure  3. Severe aortic stenosis by echo (cath hemodynamics mean gradient 25.6 mmHg, peak to peak gradient 36 mmHg, AVA 0.89cm2).  Recommendations: Will continue workup for TAVR. We will contact her to arrange her CT scans, carotid dopplers, PT assessment and appointment with the CT surgeon. I will have her increase her HCTZ to 25 mg daily. If no resolution of dyspnea and mild LE edema over the next few days, will need to stop HCTZ and start Lasix.      Recommendations Antiplatelet/Anticoag Continue planning for TAVR.        Indications Severe aortic stenosis [I35.0 (ICD-10-CM)]     Procedural Details Technical Details Indication: 86 yo female with history of severe AS, planning for TAVR  Procedure: The risks, benefits, complications, treatment options, and expected outcomes were discussed with the patient. The patient and/or family concurred with the proposed plan, giving informed consent. The patient was brought to the cath lab after IV hydration was given. The patient was sedated with Versed and Fentanyl. The IV catheter that was present in the right antecubital vein was changed for a 5 [place] sheath. Right heart catheterization performed with a balloon tipped catheter. The right wrist was prepped and draped in a sterile fashion. 1% lidocaine was used for local anesthesia. Using the modified Seldinger access technique, a 5 French sheath was placed in the right radial  artery. 3 mg Verapamil was given through the sheath. 4000 units IV heparin was given. Standard diagnostic catheters were used to perform selective coronary angiography. I crossed the aortic valve with an AL-1 and a J wire. LV pressures measured. The sheath was removed from the right radial artery and a Terumo hemostasis band was applied at the arteriotomy site on the right wrist.    Estimated blood loss <50 mL.   During this procedure medications were administered to achieve and maintain moderate conscious sedation while the patient's heart rate, blood pressure, and oxygen saturation were continuously monitored and I was present face-to-face 100% of this time.     Medications (Filter: Administrations occurring from 10/15/19 0835 to 10/15/19 0934)  Heparin (Porcine) in NaCl 1000-0.9 UT/500ML-% SOLN (mL) Total volume: 500 mL  Date/Time   Rate/Dose/Volume Action  10/15/19 0847  500 mL Given  Heparin (Porcine) in NaCl 1000-0.9 UT/500ML-% SOLN (mL) Total volume: 500 mL  Date/Time   Rate/Dose/Volume Action  10/15/19 0847  500 mL Given  fentaNYL (SUBLIMAZE) injection (mcg) Total dose: 50 mcg  Date/Time   Rate/Dose/Volume Action  10/15/19 0857  25 mcg Given  0905  25 mcg Given  midazolam (VERSED) injection (mg) Total dose: 2 mg  Date/Time   Rate/Dose/Volume Action  10/15/19 0857  1 mg Given  0905  1 mg Given  lidocaine (PF) (XYLOCAINE) 1 % injection (mL) Total volume: 6 mL  Date/Time   Rate/Dose/Volume Action  10/15/19 0905  3 mL Given  0905  3 mL Given  Radial Cocktail/Verapamil only (mL) Total volume: 10 mL  Date/Time   Rate/Dose/Volume Action  10/15/19 0907  10 mL Given  heparin injection (Units) Total dose: 4,000 Units  Date/Time   Rate/Dose/Volume Action  10/15/19 0915  4,000 Units Given  iohexol (OMNIPAQUE) 350 MG/ML injection (mL) Total volume: 35 mL  Date/Time   Rate/Dose/Volume Action  10/15/19 0929  35 mL Given     Sedation Time Sedation Time Physician-1: 28  minutes 48 seconds        Contrast Medication Name Total Dose  iohexol (OMNIPAQUE) 350 MG/ML injection 35 mL     Radiation/Fluoro Fluoro time: 4 (min)  DAP: 40973 (mGycm2)  Cumulative Air Kerma: 532 (mGy)     Complications Complications documented before study signed (10/15/2019 9:42 AM)  RIGHT/LEFT HEART CATH AND CORONARY ANGIOGRAPHY  None Documented by Burnell Blanks, MD 10/15/2019 9:40 AM  Date Found: 10/15/2019  Time Range: Intraprocedure       Coronary Findings Diagnostic Dominance: Right  Left Main  Ost LM to Mid LM lesion 10% stenosed  Ost LM to Mid LM lesion is 10% stenosed. The lesion is calcified.   Left Anterior Descending  Mid LAD lesion 20% stenosed  Mid LAD lesion is 20% stenosed.   Left Circumflex  Prox Cx lesion 10% stenosed  Prox Cx lesion is 10% stenosed.   Right Coronary Artery  Vessel is large.  Prox RCA lesion 10% stenosed  Prox RCA lesion is 10% stenosed.  Dist RCA lesion 10% stenosed  Dist RCA lesion is 10% stenosed.  Intervention No interventions have been documented.                            Coronary Diagrams Diagnostic Dominance: Right  &&&&&  Intervention      Implants  No implant documentation for this case.      [employer] Images Link to Procedure Log  Show images for CARDIAC CATHETERIZATION Procedure Log     Images on Long Term Storage   Show images for [place], [place]         [facility] Data   Most Recent Value  Fick Cardiac Output 5.11 L/min  Fick Cardiac Output Index 3.15 (L/min)/BSA  Aortic Mean Gradient 25.62 mmHg  Aortic Peak Gradient 36 mmHg  Aortic Valve Area 0.89  Aortic Value Area Index 0.55 cm2/BSA  RA A Wave 7 mmHg  RA V Wave 6 mmHg  RA Mean 5 mmHg  RV Systolic Pressure 50 mmHg  RV Diastolic Pressure 1 mmHg  RV EDP 6 mmHg  PA Systolic Pressure 47 mmHg  PA Diastolic Pressure 10 mmHg  PA Mean 31 mmHg  PW A Wave 28 mmHg  PW V Wave 44 mmHg  PW Mean 30 mmHg  AO Systolic Pressure  992 mmHg  AO Diastolic Pressure 54 mmHg  AO Mean 78 mmHg  LV Systolic Pressure 426 mmHg  LV Diastolic Pressure 12 mmHg  LV EDP 16 mmHg  AOp Systolic Pressure 834 mmHg  AOp Diastolic Pressure 57 mmHg  AOp Mean Pressure 82 mmHg  LVp Systolic Pressure 196 mmHg  LVp Diastolic Pressure 10 mmHg  LVp EDP Pressure 15 mmHg  QP/QS 1  TPVR Index 9.82 HRUI  TSVR Index 24.74 HRUI  PVR  SVR Ratio 0.11  TPVR/TSVR Ratio 0.4    ADDENDUM REPORT: 10/18/2019 21:05  CLINICAL DATA:  Severe Aortic Stenosis.  EXAM: Cardiac TAVR CT  TECHNIQUE: The patient was scanned on a [employer]. A 120 kV retrospective scan was triggered in the descending thoracic aorta at 111 HU's. Gantry rotation speed was 250 msecs and collimation was .6 mm. No beta blockade or nitro were given. The 3D data set was reconstructed in 5% intervals of the R-R cycle. Systolic and diastolic phases were analyzed on a dedicated work station using MPR, MIP and VRT modes. The patient received 80 cc of contrast.  FINDINGS: Image quality: Excellent.  Noise artifact is: Limited.  Valve Morphology: The aortic valve is tricuspid and severely calcified/thickened with restricted movement in systole. There is no protruding calcium.  Aortic Valve Area: 1.02 cm2  Aortic Valve Calcium score: 937 AU  Aortic annular dimension:  Phase assessed: 30%  Annular area: 364 mm2  Annular perimeter: 69.1 mm  Max diameter: 23.6 mm  Min diameter: 19.8 mm  Annular and subannular calcification: No significant annular or sub-annular calcifications.  Optimal coplanar projection: LAO 13 CAU 8  Coronary Artery Height above Annulus:  Left Main: 10.1 mm  Right Coronary: 16.2 mm  Sinus of Valsalva Measurements:  Non-coronary: 27 mm  Right-coronary: 26 mm  Left-coronary: 27 mm  Sinus of Valsalva Height:  Non-coronary: 22 mm  Right-coronary: 18.1 mm  Left-coronary: 18.5 mm  Sinotubular  Junction: 24 mm  Ascending Thoracic Aorta: 29 mm  Coronary Arteries: Normal coronary origin. Right dominance. The study was performed without use of NTG and is insufficient for plaque evaluation.  Cardiac Morphology:  Right Atrium: Right atrial size is dilated.  Right Ventricle: The right ventricular cavity is within normal limits.  Left Atrium: Left atrial size is dilated with no left atrial appendage filling defect.  Left Ventricle: The ventricular cavity size is within normal limits. There are no stigmata of prior infarction. There is no abnormal filling defect. LVEF=77%, no regional wall motion abnormalities.  Pulmonary arteries: Normal in size without proximal filling defect.  Pulmonary veins: Normal pulmonary venous drainage.  Pericardium: Normal thickness with no significant effusion or calcium present.  Mitral Valve: The mitral valve is normal structure with severe mitral annular calcification.  Extra-cardiac findings: See attached radiology report for non-cardiac structures.  IMPRESSION: 1. Severely thickened and calcified aortic valve consistent with severe aortic stenosis.  2. Annular measurements appropriate for 23 mm Edwards Sapien 3 valve.  3. Borderline left main coronary height to annulus (10.1 mm).  4. Optimal Fluoroscopic Angle for Delivery: LAO 13 CAU 8.  5. Severe mitral annular calcification.  <CurrentUser>, MD   Electronically Signed   By: Eleonore Chiquito   On: 10/18/2019 21:05   Addended by Geralynn Rile, MD on 10/18/2019 9:07 PM    Study Result  EXAM: OVER-READ INTERPRETATION  CT CHEST  The following report is an over-read performed by radiologist Dr. Vinnie Langton of [facility], [place] on 10/18/2019. This over-read does not include interpretation of cardiac or coronary anatomy or pathology. The coronary calcium score/coronary CTA interpretation by the cardiologist is attached.  COMPARISON:   Chest CT 04/04/2018.  FINDINGS: Extracardiac findings will be described separately under dictation for contemporaneously obtained CTA chest, abdomen and pelvis.  IMPRESSION: Please see separate dictation for contemporaneously obtained CTA chest, abdomen and pelvis dated 10/18/2019 for full description of relevant extracardiac findings.  Electronically Signed: By: Vinnie Langton M.D. On: 10/18/2019 12:07  CLINICAL DATA:  86 year old female under preprocedural evaluation prior to potential transcatheter aortic valve replacement (TAVR) procedure.  EXAM: CT ANGIOGRAPHY CHEST, ABDOMEN AND PELVIS  TECHNIQUE: Multidetector CT imaging through the chest, abdomen and pelvis was performed using the standard protocol during bolus administration of intravenous contrast. Multiplanar reconstructed images and MIPs were obtained and reviewed to evaluate the vascular anatomy.  CONTRAST:  129mL OMNIPAQUE IOHEXOL 350 MG/ML SOLN  COMPARISON:  Chest CT 04/04/2018.  FINDINGS: CTA CHEST FINDINGS  Cardiovascular: Heart size is mildly enlarged. There is no significant pericardial fluid, thickening or pericardial calcification. There is aortic atherosclerosis, as well as atherosclerosis of the great vessels of the mediastinum and the coronary arteries, including calcified atherosclerotic plaque in the left main, left anterior descending and right coronary arteries. Thickening calcification of the aortic valve. Severe calcifications of the mitral annulus and mitral valve.  Mediastinum/Lymph Nodes: No pathologically enlarged mediastinal or hilar lymph nodes. Moderate hiatal hernia. No axillary lymphadenopathy.  Lungs/Pleura: Small pulmonary nodules are noted in the lungs, largest of which is in the left upper lobe (axial image 28 of series 8), measuring 6 x 6 mm. In addition, there is a ground-glass attenuation 1.6 x 1.0 cm lesion in the left upper lobe (axial image 34 of  series 8). Patchy areas of ground-glass attenuation and interlobular septal thickening noted throughout the lungs bilaterally. No acute consolidative airspace disease. No pleural effusions.  Musculoskeletal/Soft Tissues: There are no aggressive appearing lytic or blastic lesions noted in the visualized portions of the skeleton.  CTA ABDOMEN AND PELVIS FINDINGS  Hepatobiliary: No suspicious cystic or solid hepatic lesions. No intra or extrahepatic biliary ductal dilatation. Status post cholecystectomy.  Pancreas: In the head of the pancreas (axial image 114 of series 7 and coronal image 66 of series 9) there is a 1.3 x 0.9 x 1.2 cm low-attenuation lesion in close proximity to the common bile duct. No other pancreatic mass. No pancreatic ductal dilatation. No peripancreatic fluid collections or inflammatory changes.  Spleen: Unremarkable.  Adrenals/Urinary Tract: Bilateral kidneys and bilateral adrenal glands are normal in appearance. No hydroureteronephrosis. Small amount of gas non dependently in the urinary bladder. Otherwise, urinary bladder is normal in appearance.  Stomach/Bowel: Normal appearance of the stomach. No pathologic dilatation of small bowel or colon. Numerous colonic diverticulae are noted, particularly in the sigmoid colon, without surrounding inflammatory changes to suggest an acute diverticulitis at this time. The appendix is not confidently identified and may be surgically absent. Regardless, there are no inflammatory changes noted adjacent to the cecum to suggest the presence of an acute appendicitis at this time.  Vascular/Lymphatic: Aortic atherosclerosis, with vascular findings and measurements pertinent to potential TAVR procedure, as detailed below. No aneurysm or dissection noted in the abdominal or pelvic vasculature. No lymphadenopathy noted in the abdomen or pelvis.  Reproductive: Status post hysterectomy. Ovaries are not  confidently identified may be surgically absent or trophic.  Other: No significant volume of ascites.  No pneumoperitoneum.  Musculoskeletal: There are no aggressive appearing lytic or blastic lesions noted in the visualized portions of the skeleton.  VASCULAR MEASUREMENTS PERTINENT TO TAVR:  AORTA:  Minimal Aortic Diameter-13 x 10 mm  Severity of Aortic Calcification-moderate to severe  RIGHT PELVIS:  Right Common Iliac Artery -  Minimal Diameter-8.3 x 7.9 mm  Tortuosity-mild  Calcification-mild  Right External Iliac Artery -  Minimal Diameter-5.9 x 6.6 mm  Tortuosity-mild-to-moderate  Calcification-none  Right Common Femoral Artery -  Minimal Diameter-6.6 x 6.7 mm  Tortuosity-mild  Calcification-mild  LEFT PELVIS:  Left Common Iliac Artery -  Minimal Diameter-9.2 x 8.6 mm  Tortuosity-mild  Calcification-mild  Left External Iliac Artery -  Minimal Diameter-6.1 x 6.1 mm  Tortuosity-mild-to-moderate  Calcification-none  Left Common Femoral Artery -  Minimal Diameter-6.7 x 6.9 mm  Tortuosity-mild  Calcification-mild  Review of the MIP images confirms the above findings.  IMPRESSION: 1. Vascular findings and measurements pertinent to potential TAVR procedure, as detailed above. 2. Thickening calcification of the aortic valve, compatible with the reported clinical history of severe aortic stenosis. 3. Severe calcifications also noted of the mitral annulus and mitral valve. 4. Aortic atherosclerosis, in addition to left main and 2 vessel coronary artery disease. 5. Small pulmonary nodules in the lungs, largest of which is a ground-glass attenuation nodule in the left upper lobe (1.6 x 1.0 cm). Initial follow-up with CT at 6-12 months is recommended to confirm persistence. If persistent, repeat CT is recommended every 2 years until 5 years of stability has been established. This recommendation follows the  consensus statement: Guidelines for Management of Incidental Pulmonary Nodules Detected on CT Images: From the Fleischner Society 2017; Radiology 2017; 284:228-243. 6. The appearance of the lungs suggests mild interstitial pulmonary edema. Mild cardiomegaly. Imaging findings suggestive of mild congestive heart failure. 7. 1.3 x 0.9 x 1.2 cm low-attenuation lesion in the head of the pancreas. This is nonspecific, and may simply represent a tiny pancreatic pseudocyst. However, attention on follow-up studies is recommended. Follow-up abdominal MRI with and without IV gadolinium with MRCP or pancreatic protocol CT scan is recommended in 2 months to ensure the stability of this finding. This recommendation follows ACR consensus guidelines: Management of Incidental Pancreatic Cysts: A White Paper of the ACR Incidental Findings Committee. J Am Coll Radiol 2993;71:696-789. 8. Severe colonic diverticulosis without evidence of acute diverticulitis at this time. 9. Additional incidental findings, as above.   Electronically Signed   By: Vinnie Langton M.D.   On: 10/18/2019 14:23   Procedure: Isolated AVR   Risk of Mortality: 3.536%  Renal Failure: 3.178%  Permanent Stroke: 1.607%  Prolonged Ventilation: 9.218%  DSW Infection: 0.132%  Reoperation: 2.680%  Morbidity or Mortality: 13.620%  Short Length of Stay: 25.372%  Long Length of Stay: 7.820%    Impression:  This 86 year old woman has stage D, severe, symptomatic aortic stenosis with New York Heart Association class III symptoms of exertional fatigue and shortness of breath consistent with chronic diastolic congestive heart failure.  I have personally reviewed her 2D echocardiogram, cardiac catheterization, and CTA studies.  Her echocardiogram shows a calcified aortic valve with restricted mobility.  The mean gradient was measured at 48 mmHg with an aortic valve area of 0.8 cm and a dimensionless index of 0.26 consistent with  severe aortic stenosis.  Left ventricular systolic function is normal.  In addition she probably has moderate mitral valve stenosis with a mean gradient of 17.5 mmHg and a peak gradient of 30 mmHg.  Cardiac catheterization showed mild nonobstructive coronary disease with a mean gradient measured at 25.6 mmHg and peak to peak gradient of 36 mmHg.  Aortic valve area was measured at 0.89 cm.  I agree that aortic valve replacement is indicated in this patient with progressive symptoms to improve her functional status and prevent progressive left ventricular deterioration.  She also has what is probably moderate mitral stenosis but I do not think that is driving her current progressive symptoms.  I think she would be a high risk candidate for open  surgical aortic valve replacement +/- mitral valve replacement.  I think transcatheter aortic valve replacement would be the best treatment for her.  Her gated cardiac CTA shows anatomy suitable for transcatheter aortic valve replacement using a SAPIEN 3 valve.  Her abdominal and pelvic CTA shows adequate pelvic vascular anatomy to allow transfemoral insertion.  The patient was counseled at length regarding treatment alternatives for management of severe symptomatic aortic stenosis. The risks and benefits of surgical intervention has been discussed in detail. Long-term prognosis with medical therapy was discussed. Alternative approaches such as conventional surgical aortic valve replacement, transcatheter aortic valve replacement, and palliative medical therapy were compared and contrasted at length. This discussion was placed in the context of the patient's own specific clinical presentation and past medical history. All of her questions have been addressed.   Following the decision to proceed with transcatheter aortic valve replacement, a discussion was held regarding what types of management strategies would be attempted intraoperatively in the event of life-threatening  complications, including whether or not the patient would be considered a candidate for the use of cardiopulmonary bypass and/or conversion to open sternotomy for attempted surgical intervention. The patient is aware of the fact that transient use of cardiopulmonary bypass may be necessary.  I think she would be a candidate for emergent sternotomy to manage any intraoperative complications although her operative risk would obviously be high.  The patient has been advised of a variety of complications that might develop including but not limited to risks of death, stroke, paravalvular leak, aortic dissection or other major vascular complications, aortic annulus rupture, device embolization, cardiac rupture or perforation, mitral regurgitation, acute myocardial infarction, arrhythmia, heart block or bradycardia requiring permanent pacemaker placement, congestive heart failure, respiratory failure, renal failure, pneumonia, infection, other late complications related to structural valve deterioration or migration, or other complications that might ultimately cause a temporary or permanent loss of functional independence or other long term morbidity. The patient provides full informed consent for the procedure as described and all questions were answered.    Plan:  She will be scheduled for transfemoral transcatheter aortic valve replacement on Tuesday, 11/06/2019.   I spent 70 minutes performing this consultation and > 50% of this time was spent face to face counseling and coordinating the care of this patient's severe symptomatic aortic stenosis.   Gaye Pollack, MD 10/24/2019

## 2019-10-25 ENCOUNTER — Other Ambulatory Visit: Payer: Self-pay | Admitting: Physician Assistant

## 2019-10-25 DIAGNOSIS — I350 Nonrheumatic aortic (valve) stenosis: Secondary | ICD-10-CM

## 2019-10-26 DIAGNOSIS — N390 Urinary tract infection, site not specified: Secondary | ICD-10-CM | POA: Diagnosis not present

## 2019-11-01 NOTE — Pre-Procedure Instructions (Signed)
[place], [place]  [id number] Phone: [phone] Fax: [phone]     Your procedure is scheduled on Tuesday, April 20, from 09:00 AM- 11:00 AM.  Report to [facility] Entr[facility] "A" at 07:00 A.M., and check in at the Admitting office.  Call this number if you have problems the morning of surgery:  [phone]  Call [phone] if you have any questions prior to your surgery date Monday-Friday 8am-4pm.    Remember:  Do not eat or drink after midnight the night before your surgery.   Continue taking all of your current medications through the day before surgery.   *On the day of surgery take no medications.*                     Do not wear jewelry, make up, or nail polish.            Do not wear lotions, powders, perfumes, or deodorant.            Do not shave 48 hours prior to surgery.              Do not bring valuables to the hospital.            [facility] is not responsible for any belongings or valuables.  Do NOT Smoke (Tobacco/Vapping) or drink Alcohol 24 hours prior to your procedure.  If you use a CPAP at night, you may bring all equipment for your overnight stay.   Contacts, glasses, dentures or bridgework may not be worn into surgery.      For patients admitted to the hospital, discharge time will be determined by your treatment team.   Patients discharged the day of surgery will not be allowed to drive home, and someone needs to stay with them for 24 hours.    Special instructions:   [place]- Preparing For Surgery  Before surgery, you can play an important role. Because skin is not sterile, your skin needs to be as free of germs as possible. You can reduce the number of germs on your skin by washing with CHG (chlorahexidine gluconate) Soap before surgery.  CHG is an antiseptic cleaner which kills germs and bonds with the skin to continue killing germs even after washing.    Oral Hygiene  is also important to reduce your risk of infection.  Remember - BRUSH YOUR TEETH THE MORNING OF SURGERY WITH YOUR REGULAR TOOTHPASTE  Please do not use if you have an allergy to CHG or antibacterial soaps. If your skin becomes reddened/irritated stop using the CHG.  Do not shave (including legs and underarms) for at least 48 hours prior to first CHG shower. It is OK to shave your face.  Please follow these instructions carefully.   1. Shower the NIGHT BEFORE SURGERY and the MORNING OF SURGERY with CHG Soap.   2. If you chose to wash your hair, wash your hair first as usual with your normal shampoo.  3. After you shampoo, rinse your hair and body thoroughly to remove the shampoo.  4. Use CHG as you would any other liquid soap. You can apply CHG directly to the skin and wash gently with a scrungie or a clean washcloth.   5. Apply the CHG Soap to your body ONLY FROM THE NECK DOWN.  Do not use on open wounds or open sores. Avoid contact with your eyes, ears, mouth and genitals (private parts).  Wash Face and genitals (private parts)  with your normal soap.   6. Wash thoroughly, paying special attention to the area where your surgery will be performed.  7. Thoroughly rinse your body with warm water from the neck down.  8. DO NOT shower/wash with your normal soap after using and rinsing off the CHG Soap.  9. Pat yourself dry with a CLEAN TOWEL.  10. Wear CLEAN PAJAMAS to bed the night before surgery, wear comfortable clothes the morning of surgery  11. Place CLEAN SHEETS on your bed the night of your first shower and DO NOT SLEEP WITH PETS.   Day of Surgery:   Do not apply any deodorants/lotions.  Please wear clean clothes to the hospital/surgery center.   Remember to brush your teeth WITH YOUR REGULAR TOOTHPASTE.   Please read over the following fact sheets that you were given.

## 2019-11-02 ENCOUNTER — Other Ambulatory Visit: Payer: Self-pay

## 2019-11-02 ENCOUNTER — Encounter (HOSPITAL_COMMUNITY): Payer: Self-pay

## 2019-11-02 ENCOUNTER — Ambulatory Visit (HOSPITAL_COMMUNITY)
Admission: RE | Admit: 2019-11-02 | Discharge: 2019-11-02 | Disposition: A | Discharge status: Home / Self Care | Payer: Medicare Other | Source: Ambulatory Visit | Attending: Physician Assistant | Admitting: Physician Assistant

## 2019-11-02 ENCOUNTER — Encounter (HOSPITAL_COMMUNITY)
Admission: RE | Admit: 2019-11-02 | Discharge: 2019-11-02 | Disposition: A | Discharge status: Home / Self Care | Payer: Medicare Other | Source: Ambulatory Visit | Attending: Cardiovascular Disease | Admitting: Cardiovascular Disease

## 2019-11-02 ENCOUNTER — Other Ambulatory Visit (HOSPITAL_COMMUNITY)
Admission: RE | Admit: 2019-11-02 | Discharge: 2019-11-02 | Disposition: A | Discharge status: Home / Self Care | Payer: Medicare Other | Source: Ambulatory Visit | Attending: Cardiovascular Disease | Admitting: Cardiovascular Disease

## 2019-11-02 DIAGNOSIS — I350 Nonrheumatic aortic (valve) stenosis: Secondary | ICD-10-CM | POA: Diagnosis not present

## 2019-11-02 DIAGNOSIS — Z01818 Encounter for other preprocedural examination: Secondary | ICD-10-CM | POA: Diagnosis not present

## 2019-11-02 DIAGNOSIS — R9431 Abnormal electrocardiogram [ECG] [EKG]: Secondary | ICD-10-CM | POA: Insufficient documentation

## 2019-11-02 DIAGNOSIS — I493 Ventricular premature depolarization: Secondary | ICD-10-CM | POA: Diagnosis not present

## 2019-11-02 DIAGNOSIS — Z20822 Contact with and (suspected) exposure to covid-19: Secondary | ICD-10-CM | POA: Diagnosis not present

## 2019-11-02 HISTORY — DX: Unspecified osteoarthritis, unspecified site: M19.90

## 2019-11-02 HISTORY — DX: Prediabetes: R73.03

## 2019-11-02 HISTORY — DX: Dyspnea, unspecified: R06.00

## 2019-11-02 LAB — COMPREHENSIVE METABOLIC PANEL
ALT: 19 U/L (ref 0–44)
AST: 20 U/L (ref 15–41)
Albumin: 4.2 g/dL (ref 3.5–5.0)
Alkaline Phosphatase: 35 U/L — ABNORMAL LOW (ref 38–126)
Anion gap: 11 (ref 5–15)
BUN: 18 mg/dL (ref 8–23)
CO2: 25 mmol/L (ref 22–32)
Calcium: 9.8 mg/dL (ref 8.9–10.3)
Chloride: 100 mmol/L (ref 98–111)
Creatinine, Ser: 1.04 mg/dL — ABNORMAL HIGH (ref 0.44–1.00)
GFR calc Af Amer: 57 mL/min — ABNORMAL LOW (ref >60)
GFR calc non Af Amer: 49 mL/min — ABNORMAL LOW (ref >60)
Glucose, Bld: 117 mg/dL — ABNORMAL HIGH (ref 70–99)
Potassium: 3.8 mmol/L (ref 3.5–5.1)
Sodium: 136 mmol/L (ref 135–145)
Total Bilirubin: 0.5 mg/dL (ref 0.3–1.2)
Total Protein: 6.6 g/dL (ref 6.5–8.1)

## 2019-11-02 LAB — BLOOD GAS, ARTERIAL
Acid-Base Excess: 3.7 mmol/L — ABNORMAL HIGH (ref 0.0–2.0)
Bicarbonate: 27.3 mmol/L (ref 20.0–28.0)
Drawn by: 421801
FIO2: 21
O2 Saturation: 97.6 %
Patient temperature: 37
pCO2 arterial: 38.7 mmHg (ref 32.0–48.0)
pH, Arterial: 7.463 — ABNORMAL HIGH (ref 7.350–7.450)
pO2, Arterial: 104 mmHg (ref 83.0–108.0)

## 2019-11-02 LAB — URINALYSIS, ROUTINE W REFLEX MICROSCOPIC
Bilirubin Urine: NEGATIVE
Glucose, UA: NEGATIVE mg/dL
Hgb urine dipstick: NEGATIVE
Ketones, ur: NEGATIVE mg/dL
Leukocytes,Ua: NEGATIVE
Nitrite: NEGATIVE
Protein, ur: NEGATIVE mg/dL
Specific Gravity, Urine: 1.014 (ref 1.005–1.030)
pH: 7 (ref 5.0–8.0)

## 2019-11-02 LAB — CBC
HCT: 36.6 % (ref 36.0–46.0)
Hemoglobin: 11.6 g/dL — ABNORMAL LOW (ref 12.0–15.0)
MCH: 27.2 pg (ref 26.0–34.0)
MCHC: 31.7 g/dL (ref 30.0–36.0)
MCV: 85.7 fL (ref 80.0–100.0)
Platelets: 243 10*3/uL (ref 150–400)
RBC: 4.27 MIL/uL (ref 3.87–5.11)
RDW: 13.3 % (ref 11.5–15.5)
WBC: 6.7 10*3/uL (ref 4.0–10.5)
nRBC: 0 % (ref 0.0–0.2)

## 2019-11-02 LAB — SURGICAL PCR SCREEN
MRSA, PCR: NEGATIVE
Staphylococcus aureus: NEGATIVE

## 2019-11-02 LAB — HEMOGLOBIN A1C
Hgb A1c MFr Bld: 5.7 % — ABNORMAL HIGH (ref 4.8–5.6)
Mean Plasma Glucose: 116.89 mg/dL

## 2019-11-02 LAB — BRAIN NATRIURETIC PEPTIDE: B Natriuretic Peptide: 297.2 pg/mL — ABNORMAL HIGH (ref 0.0–100.0)

## 2019-11-02 LAB — PROTIME-INR
INR: 0.9 (ref 0.8–1.2)
Prothrombin Time: 12 seconds (ref 11.4–15.2)

## 2019-11-02 LAB — GLUCOSE, CAPILLARY: Glucose-Capillary: 114 mg/dL — ABNORMAL HIGH (ref 70–99)

## 2019-11-02 LAB — APTT: aPTT: 31 seconds (ref 24–36)

## 2019-11-02 LAB — SARS CORONAVIRUS 2 (TAT 6-24 HRS): SARS Coronavirus 2: NEGATIVE

## 2019-11-02 NOTE — Progress Notes (Signed)
PCP - Ihor Dow, PA Cardiologist - Dr. Shirlee More Nephrology: Dr. Madelon Lips   PPM/ICD - Denies  Chest x-ray - 11/02/19 EKG - 11/02/19 Stress Test - Pt states she had one many years ago. ECHO - 08/29/19 Cardiac Cath - 10/15/19  Sleep Study - Denies  Pt states she is pre-diabetic, but does not check her sugars.  Pt instructed to continue all medications up until the day before surgery. No medications the day of surgery.  ERAS Protcol - No  COVID TEST- Scheduled 11/02/19   Coronavirus Screening  Have you experienced the following symptoms:  Cough yes/no: No Fever (>100.72F)  yes/no: No Runny nose yes/no: No Sore throat yes/no: No Difficulty breathing/shortness of breath  yes/no: No  Have you or a family member traveled in the last 14 days and where? yes/no: No   If the patient indicates "YES" to the above questions, their PAT will be rescheduled to limit the exposure to others and, the surgeon will be notified. THE PATIENT WILL NEED TO BE ASYMPTOMATIC FOR 14 DAYS.   If the patient is not experiencing any of these symptoms, the PAT nurse will instruct them to NOT bring anyone with them to their appointment since they may have these symptoms or traveled as well.   Please remind your patients and families that hospital visitation restrictions are in effect and the importance of the restrictions.     Anesthesia review: Yes, cardiac hx  Patient denies shortness of breath, fever, cough and chest pain at PAT appointment   All instructions explained to the patient, with a verbal understanding of the material. Patient agrees to go over the instructions while at home for a better understanding. Patient also instructed to self quarantine after being tested for COVID-19. The opportunity to ask questions was provided.

## 2019-11-05 MED ORDER — VANCOMYCIN HCL 1250 MG/250ML IV SOLN
1250.0000 mg | INTRAVENOUS | Status: AC
  Administered 2019-11-06: 09:00:00 1250 mg via INTRAVENOUS
  Filled 2019-11-05 (×2): qty 250

## 2019-11-05 MED ORDER — NOREPINEPHRINE 4 MG/250ML-% IV SOLN
0.0000 ug/min | INTRAVENOUS | Status: DC
  Filled 2019-11-05: qty 250

## 2019-11-05 MED ORDER — MAGNESIUM SULFATE 50 % IJ SOLN
40.0000 meq | INTRAMUSCULAR | Status: DC
  Filled 2019-11-05: qty 9.85

## 2019-11-05 MED ORDER — SODIUM CHLORIDE 0.9 % IV SOLN
1.5000 g | INTRAVENOUS | Status: AC
  Administered 2019-11-06: 10:00:00 1.5 g via INTRAVENOUS
  Filled 2019-11-05 (×2): qty 1.5

## 2019-11-05 MED ORDER — DEXMEDETOMIDINE HCL IN NACL 400 MCG/100ML IV SOLN
0.1000 ug/kg/h | INTRAVENOUS | Status: AC
  Administered 2019-11-06: 09:00:00 1 ug/kg/h via INTRAVENOUS
  Filled 2019-11-05 (×2): qty 100

## 2019-11-05 MED ORDER — SODIUM CHLORIDE 0.9 % IV SOLN
INTRAVENOUS | Status: DC
  Filled 2019-11-05 (×2): qty 30

## 2019-11-05 MED ORDER — POTASSIUM CHLORIDE 2 MEQ/ML IV SOLN
80.0000 meq | INTRAVENOUS | Status: DC
  Filled 2019-11-05: qty 40

## 2019-11-05 NOTE — H&P (Signed)
[address]       [place] [address]             [phone]      Cardiothoracic Surgery Admission History and Physical   Referring Provider is Richardo Priest, MD  Primary Cardiologist is No primary care provider on file.  PCP is Renaldo Reel, PA      Chief Complaint  Patient presents with  . Aortic Stenosis       HPI:  The patient is an 86 year old woman with a history of hypertension, hyperlipidemia, stage III chronic kidney disease, prediabetes, and aortic stenosis dating back to 2016 when it was noted to be mild with a mean gradient of 13.5 mmHg. She has had a progressive increase in the mean gradient on subsequent echoes and had an echo on 02/21/2019 showing increase in the mean gradient to 39.5 mmHg with a peak gradient of 66.3 mmHg. Her echocardiogram also showed a degenerative mitral valve with moderate mitral annular calcification and a mean mitral valve gradient of 13.5 mmHg with a peak gradient of 26.3 mmHg. Valve area was 2.91 cm by pressure half-time. She now presents with a several month history of progressive exertional fatigue and shortness of breath which she feels has significantly worsened over the past month or so. She is able to do her ADLs in the house but has to move slowly and take rest breaks. She had a follow-up echocardiogram on 08/29/2019 which showed an increase in the mean aortic valve gradient to 48 mmHg with a peak gradient of 79 mmHg. Aortic valve area was measured at 0.82 cm by VTI. Dimensionless index was 0.26. The mitral valve mean gradient had also increased to 17.5 mmHg with a peak gradient of 30 mmHg. Valve area by pressure half-time is 2.73 cm. There is no mitral valve regurgitation.      Past Medical History:  Diagnosis Date  . CKD (chronic kidney disease) 03/08/2017  . GERD (gastroesophageal reflux disease) 03/08/2017  . Hemoptysis 04/04/2018  . Hypertensive heart disease 03/08/2017  . Prediabetes 03/08/2017  . Pure  hypercholesterolemia 04/08/2015   Overview: Intolerant of pravastatin and simvastatin  . Rectal cancer ([place])   . Severe aortic stenosis   . Vitamin D deficiency 03/08/2017        Past Surgical History:  Procedure Laterality Date  . APPENDECTOMY    . BLADDER SURGERY    . BREAST BIOPSY    . CESAREAN SECTION    . CHOLECYSTECTOMY    . COLON SURGERY    . EUS N/A 02/24/2017   Procedure: LOWER ENDOSCOPIC ULTRASOUND (EUS); Surgeon: Milus Banister, MD; Location: [facility]; Service: Endoscopy; Laterality: N/A;  . RIGHT/LEFT HEART CATH AND CORONARY ANGIOGRAPHY N/A 10/15/2019   Procedure: RIGHT/LEFT HEART CATH AND CORONARY ANGIOGRAPHY; Surgeon: Burnell Blanks, MD; Location: [facility]; Service: Cardiovascular; Laterality: N/A;  . TONSILLECTOMY          Family History  Problem Relation Age of Onset  . Heart attack Mother   . Congestive Heart Failure Father   . Parkinson's disease Sister   . Heart disease Sister   . AAA (abdominal aortic aneurysm) Brother    Social History        Socioeconomic History  . Marital status: Widowed    Spouse name: Not on file  . Number of children: Not on file  . Years of education: Not on file  . Highest education level: Not on file  Occupational History  . Not on file  Tobacco Use  . Smoking status: Never Smoker  . Smokeless tobacco: Never Used  Substance and Sexual Activity  . Alcohol use: No  . Drug use: No  . Sexual activity: Not on file  Other Topics Concern  . Not on file  Social History Narrative  . Not on file   Social Determinants of Health      Financial Resource Strain:   . Difficulty of Paying Living Expenses:   Food Insecurity:   . Worried About Charity fundraiser in the Last Year:   . Arboriculturist in the Last Year:   Transportation Needs:   . Film/video editor (Medical):   Marland Kitchen Lack of Transportation (Non-Medical):   Physical Activity:   . Days of Exercise per Week:   . Minutes of Exercise per  Session:   Stress:   . Feeling of Stress :   Social Connections:   . Frequency of Communication with Friends and Family:   . Frequency of Social Gatherings with Friends and Family:   . Attends Religious Services:   . Active Member of Clubs or Organizations:   . Attends Archivist Meetings:   Marland Kitchen Marital Status:   Intimate Partner Violence:   . Fear of Current or Ex-Partner:   . Emotionally Abused:   Marland Kitchen Physically Abused:   . Sexually Abused:          Current Outpatient Medications  Medication Sig Dispense Refill  . aluminum hydroxide-magnesium carbonate (GAVISCON) 95-358 MG/15ML SUSP Take 30 mLs by mouth at bedtime. Hiatal hernia    . amLODipine (NORVASC) 10 MG tablet Take 10 mg by mouth daily.     Marland Kitchen aspirin EC 81 MG tablet Take 81 mg by mouth daily.    . benazepril (LOTENSIN) 40 MG tablet Take 40 mg by mouth daily.    . Calcium Carb-Cholecalciferol (CALCIUM 600+D3 PO) Take 1 tablet by mouth daily.    . calcium carbonate (TUMS EX) 750 MG chewable tablet Chew 1 tablet by mouth 4 (four) times daily as needed for heartburn.     . cholecalciferol (VITAMIN D) 1000 units tablet Take 1,000 Units by mouth daily.    . Cholecalciferol (VITAMIN D-3) 125 MCG (5000 UT) TABS Take 5,000 Units by mouth daily.    . Coenzyme Q10 (COQ10) 100 MG CAPS Take 100 mg by mouth at bedtime.    Marland Kitchen ezetimibe (ZETIA) 10 MG tablet Take 10 mg by mouth every evening.    . fenofibrate (TRICOR) 145 MG tablet Take 145 mg by mouth every Tuesday, Thursday, Saturday, and Sunday at 6 PM.     . ferrous gluconate (IRON 27) 240 (27 FE) MG tablet Take 27 mg by mouth daily.    . hydrochlorothiazide (MICROZIDE) 12.5 MG capsule Take 12.5 mg by mouth daily.    . montelukast (SINGULAIR) 10 MG tablet Take 10 mg by mouth daily as needed (sinus drainage).     . Multiple Vitamin (MULTIVITAMIN WITH MINERALS) TABS tablet Take 1 tablet by mouth daily.    Marland Kitchen nystatin cream (MYCOSTATIN) Apply 1 application topically 2 (two) times daily  as needed (irritation).    Marland Kitchen omeprazole (PRILOSEC) 20 MG capsule Take 20 mg by mouth daily before breakfast.     . OVER THE COUNTER MEDICATION Take 1,500-3,000 mg by mouth See admin instructions. (D-mannose) Take 1 capsule (1500 mg) by mouth in the morning & take 2 capsules (3000 mg) by mouth at night.    Marland Kitchen  Potassium 99 MG TABS Take 99 mg by mouth daily.    . pravastatin (PRAVACHOL) 20 MG tablet Take 40 mg by mouth every Monday, Wednesday, and Friday. At night     . Probiotic Product (PROBIOTIC PO) Take 1 capsule by mouth daily.    Marland Kitchen tiZANidine (ZANAFLEX) 2 MG tablet Take 2 mg by mouth at bedtime as needed for muscle spasms.     . Turmeric 500 MG CAPS Take 500 mg by mouth daily.     . Wheat Dextrin (BENEFIBER DRINK MIX) PACK Take 1 Package by mouth every other day as needed (constipation.).      No current facility-administered medications for this visit.        Allergies  Allergen Reactions  . Claritin-D 12 Hour [Loratadine-Pseudoephedrine Er]     Pt was taking a 12 day course when she stopped course pt starting have rapid heart beat   . Codeine     "climb the wall"  . Lisinopril     Swelling of tongue   . Statins     Muscle and joint pain   Review of Systems:    General: normal appetite, + decreased energy, no weight gain, no weight loss, no fever  Cardiac: no chest pain with exertion, no chest pain at rest, +SOB with mild exertion, no resting SOB, no PND, no orthopnea, no palpitations, no arrhythmia, no atrial fibrillation, + LE edema, + positional dizzy spells, no syncope  Respiratory: + exertional shortness of breath, no home oxygen, no productive cough, no dry cough, no bronchitis, no wheezing, no hemoptysis, no asthma, no pain with inspiration or cough, no sleep apnea, no CPAP at night  GI: no difficulty swallowing, + reflux, no frequent heartburn, + hiatal hernia, no abdominal pain, no constipation, no diarrhea, no hematochezia, no hematemesis, no melena  GU: no dysuria, no  frequency, no urinary tract infection, no hematuria, no kidney stones, no kidney disease  Vascular: no pain suggestive of claudication, no pain in feet, no leg cramps, no varicose veins, no DVT, no non-healing foot ulcer  Neuro: no stroke, no TIA's, no seizures, no headaches, no temporary blindness one eye, no slurred speech, no peripheral neuropathy, no chronic pain, no instability of gait, no memory/cognitive dysfunction  Musculoskeletal: no arthritis, no joint swelling, no myalgias, no difficulty walking, normal mobility  Skin: no rash, no itching, no skin infections, no pressure sores or ulcerations  Psych: no anxiety, no depression, no nervousness, no unusual recent stress  Eyes: no blurry vision, no floaters, no recent vision changes, + wears glasses  ENT: no hearing loss, no loose or painful teeth  Hematologic: no easy bruising, no abnormal bleeding, no clotting disorder, no frequent epistaxis  Endocrine: no diabetes, does not check CBG's at home    Physical Exam:   BP (!) 160/74  Pulse 100  Temp 97.8 F (36.6 C) (Skin)  Resp 20  Ht 4\' 9"  (1.448 m)  Wt 155 lb (70.3 kg)  SpO2 94% Comment: RA  BMI 33.54 kg/m  General: Elderly but well-appearing  HEENT: Unremarkable, NCAT, PERLA, EOMI,  Neck: no JVD, no bruits, no adenopathy  Chest: clear to auscultation, symmetrical breath sounds, no wheezes, no rhonchi  CV: RRR, grade lll/VI crescendo/decrescendo murmur heard best at RSB, no diastolic murmur  Abdomen: soft, non-tender, no masses  Extremities: warm, well-perfused, pulses palpable at ankle, no LE edema  Rectal/GU Deferred  Neuro: Grossly non-focal and symmetrical throughout  Skin: Clean and dry, no rashes, no breakdown  Diagnostic Tests:   ECHOCARDIOGRAM REPORT     Patient Name: CIA GARRETSON Date of Exam: 08/29/2019  Medical Rec #: [id number] Height: 59.0 in  Accession #: [id number] Weight: 153.6 lb  Date of Birth: 07-22-1931 BSA: 1.65 m  Patient Age: 27 years BP:  144/80 mmHg  Patient Gender: F HR: 97 bpm.  Exam Location: [facility]   Procedure: 2D Echo   Indications: Nonrheumatic aortic valve stenosis [I35.0] - Primary   History: Patient has no prior history of Echocardiogram  examinations.   Sonographer: Luane School  Referring Phys: [phone] [place]    1. Left ventricular ejection fraction, by estimation, is 65 to 70%. The  left ventricle has normal function. The left ventrical has no regional  wall motion abnormalities. There is moderately increased left ventricular  hypertrophy. Left ventricular  diastolic function could not be evaluated.  2. Left atrial size was moderately dilated.  3. MS visually appears to be at least moderate, however MVA by P1/2 is  only 2.72 cm2. Gradent accross MV significantly elevated. Consider TEE for  better assessment of MS. The mitral valve is normal in structure and  function. no evidence of mitral valve  regurgitation. Mild to moderate mitral stenosis.  4. Aortic valve regurgitation is not visualized. Severe aortic valve  stenosis.  Progression of the AS as compared to study from 02/21/2019. Peak/mean  gradient was 66/39 mmHg, DI was .29. Now Peak/mean gradient 79/48 mmHg,  AVA 08 cm2, DI 0.26   FINDINGS  Left Ventricle: Left ventricular ejection fraction, by estimation, is 65  to 70%. The left ventricle has normal function. The left ventricle has no  regional wall motion abnormalities. The left ventricular internal cavity  size was normal in size. There is  moderately increased left ventricular hypertrophy. Left ventricular  diastolic function could not be evaluated.   Right Ventricle: The right ventricular size is normal. No increase in  right ventricular wall thickness. Right ventricular systolic function is  normal. There is mildly elevated pulmonary artery systolic pressure. The  tricuspid regurgitant velocity is 2.65  m/s, and with an assumed right atrial pressure of 3 mmHg,  the estimated  right ventricular systolic pressure is 16.9 mmHg.   Left Atrium: Left atrial size was moderately dilated.   Right Atrium: Right atrial size was normal in size.   Pericardium: There is no evidence of pericardial effusion.   Mitral Valve: MS visually appears to be at least moderate, however MVA by  P1/2 is only 2.72 cm2. Gradent accross MV significantly elevated. Consider  TEE for better assessment of MS. The mitral valve is normal in structure  and function. There is moderate  calcification of the mitral valve leaflet(s). Moderately decreased  mobility of the mitral valve leaflets. Moderate mitral annular  calcification. No evidence of mitral valve regurgitation. Mild to moderate  mitral valve stenosis. MV peak gradient, 30.0  mmHg. The mean mitral valve gradient is 17.5 mmHg.   Tricuspid Valve: The tricuspid valve is normal in structure. Tricuspid  valve regurgitation is mild . No evidence of tricuspid stenosis.   Aortic Valve: Progression of the AS as compared to study from 02/21/2019.  Peak/mean gradient was 66/39 mmHg, DI was .29. Now Peak/mean gradient  79/48 mmHg, AVA 08 cm2, DI 0.26.  The aortic valve is normal in structure and function. Aortic valve  regurgitation is not visualized. Severe aortic stenosis is present. Aortic  valve mean gradient measures 48.0 mmHg. Aortic valve peak gradient  measures 78.9 mmHg. Aortic valve area, by VTI  measures 0.82 cm.   Pulmonic Valve: The pulmonic valve was normal in structure. Pulmonic valve  regurgitation is not visualized. No evidence of pulmonic stenosis.   Aorta: The aortic root is normal in size and structure.   Venous: The inferior vena cava is normal in size with greater than 50%  respiratory variability, suggesting right atrial pressure of 3 mmHg. The  inferior vena cava and the hepatic vein show a normal flow pattern.   IAS/Shunts: No atrial level shunt detected by color flow Doppler.    LEFT VENTRICLE   PLAX 2D  LVIDd: 3.70 cm Diastology  LVIDs: 2.60 cm LV e' lateral: 4.24 cm/s  LV PW: 1.60 cm LV E/e' lateral: 58.3  LV IVS: 1.70 cm LV e' medial: 4.13 cm/s  LVOT diam: 2.00 cm LV E/e' medial: 59.8  LV SV: 71.63 ml  LV SV Index: 19.35  LVOT Area: 3.14 cm    RIGHT VENTRICLE IVC  RV S prime: 18.20 cm/s IVC diam: 1.40 cm  TAPSE (M-mode): 2.5 cm   LEFT ATRIUM Index RIGHT ATRIUM Index  LA diam: 4.40 cm 2.67 cm/m RA Area: 13.30 cm  LA Vol (A2C): 111.0 ml 67.33 ml/m RA Volume: 28.30 ml 17.17 ml/m  LA Vol (A4C): 56.1 ml 34.03 ml/m  LA Biplane Vol: 81.0 ml 49.13 ml/m  AORTIC VALVE  AV Area (Vmax): 0.82 cm  AV Area (Vmean): 0.79 cm  AV Area (VTI): 0.82 cm  AV Vmax: 444.00 cm/s  AV Vmean: 325.000 cm/s  AV VTI: 0.878 m  AV Peak Grad: 78.9 mmHg  AV Mean Grad: 48.0 mmHg  LVOT Vmax: 116.00 cm/s  LVOT Vmean: 81.800 cm/s  LVOT VTI: 0.228 m  LVOT/AV VTI ratio: 0.26   AORTA  Ao Root diam: 2.60 cm  Ao Asc diam: 3.10 cm   MITRAL VALVE TRICUSPID VALVE  MV Area (PHT): 2.73 cm TR Peak grad: 28.1 mmHg  MV Peak grad: 30.0 mmHg TR Vmax: 265.00 cm/s  MV Mean grad: 17.5 mmHg  MV Vmax: 2.74 m/s SHUNTS  MV Vmean: 194.0 cm/s Systemic VTI: 0.23 m  MV Decel Time: 278 msec Systemic Diam: 2.00 cm  MV E velocity: 247.00 cm/s 103 cm/s  MV A velocity: 213.00 cm/s 70.3 cm/s  MV E/A ratio: 1.16 1.5   Jenne Campus MD  Electronically signed by Jenne Campus MD  Signature Date/Time: 08/30/2019/7:05:32 PM    Physicians  Panel Physicians Referring Physician Case Authorizing Physician  Burnell Blanks, MD (Primary)       Procedures  RIGHT/LEFT HEART CATH AND CORONARY ANGIOGRAPHY     Conclusion  Prox RCA lesion is 10% stenosed.  Dist RCA lesion is 10% stenosed.  Ost LM to Mid LM lesion is 10% stenosed.  Prox Cx lesion is 10% stenosed.  Mid LAD lesion is 20% stenosed. 1. Mild non-obstructive CAD  2. Elevated wedge pressure  3. Severe aortic stenosis by echo (cath  hemodynamics mean gradient 25.6 mmHg, peak to peak gradient 36 mmHg, AVA 0.89cm2).  Recommendations: Will continue workup for TAVR. We will contact her to arrange her CT scans, carotid dopplers, PT assessment and appointment with the CT surgeon. I will have her increase her HCTZ to 25 mg daily. If no resolution of dyspnea and mild LE edema over the next few days, will need to stop HCTZ and start Lasix.      Recommendations  Antiplatelet/Anticoag Continue planning for TAVR.        Indications  Severe aortic stenosis [I35.0 (ICD-10-CM)]     Procedural Details  Technical Details Indication: 86 yo female with history of severe AS, planning for TAVR  Procedure: The risks, benefits, complications, treatment options, and expected outcomes were discussed with the patient. The patient and/or family concurred with the proposed plan, giving informed consent. The patient was brought to the cath lab after IV hydration was given. The patient was sedated with Versed and Fentanyl. The IV catheter that was present in the right antecubital vein was changed for a 5 [place] sheath. Right heart catheterization performed with a balloon tipped catheter. The right wrist was prepped and draped in a sterile fashion. 1% lidocaine was used for local anesthesia. Using the modified Seldinger access technique, a 5 French sheath was placed in the right radial artery. 3 mg Verapamil was given through the sheath. 4000 units IV heparin was given. Standard diagnostic catheters were used to perform selective coronary angiography. I crossed the aortic valve with an AL-1 and a J wire. LV pressures measured. The sheath was removed from the right radial artery and a Terumo hemostasis band was applied at the arteriotomy site on the right wrist.    Estimated blood loss <50 mL.   During this procedure medications were administered to achieve and maintain moderate conscious sedation while the patient's heart rate, blood pressure, and  oxygen saturation were continuously monitored and I was present face-to-face 100% of this time.     Medications  (Filter: Administrations occurring from 10/15/19 0835 to 10/15/19 0934)  Heparin (Porcine) in NaCl 1000-0.9 UT/500ML-% SOLN (mL)  Total volume: 500 mL  Date/Time   Rate/Dose/Volume Action  10/15/19 0847  500 mL Given  Heparin (Porcine) in NaCl 1000-0.9 UT/500ML-% SOLN (mL)  Total volume: 500 mL  Date/Time   Rate/Dose/Volume Action  10/15/19 0847  500 mL Given  fentaNYL (SUBLIMAZE) injection (mcg)  Total dose: 50 mcg  Date/Time   Rate/Dose/Volume Action  10/15/19 0857  25 mcg Given  0905  25 mcg Given  midazolam (VERSED) injection (mg)  Total dose: 2 mg  Date/Time   Rate/Dose/Volume Action  10/15/19 0857  1 mg Given  0905  1 mg Given  lidocaine (PF) (XYLOCAINE) 1 % injection (mL)  Total volume: 6 mL  Date/Time   Rate/Dose/Volume Action  10/15/19 0905  3 mL Given  0905  3 mL Given  Radial Cocktail/Verapamil only (mL)  Total volume: 10 mL  Date/Time   Rate/Dose/Volume Action  10/15/19 0907  10 mL Given  heparin injection (Units)  Total dose: 4,000 Units  Date/Time   Rate/Dose/Volume Action  10/15/19 0915  4,000 Units Given  iohexol (OMNIPAQUE) 350 MG/ML injection (mL)  Total volume: 35 mL  Date/Time   Rate/Dose/Volume Action  10/15/19 0929  35 mL Given     Sedation Time  Sedation Time Physician-1: 28 minutes 48 seconds        Contrast  Medication Name Total Dose  iohexol (OMNIPAQUE) 350 MG/ML injection 35 mL     Radiation/Fluoro  Fluoro time: 4 (min)  DAP: 53664 (mGycm2)  Cumulative Air Kerma: 403 (mGy)     Complications     Complications documented before study signed (10/15/2019 9:42 AM)  RIGHT/LEFT HEART CATH AND CORONARY ANGIOGRAPHY  None Documented by Burnell Blanks, MD 10/15/2019 9:40 AM  Date Found: 10/15/2019  Time Range: Intraprocedure       Coronary Findings  Diagnostic  Dominance: Right  Left Main  Ost LM to Mid  LM lesion 10% stenosed  Ost LM to Mid LM lesion is 10% stenosed. The lesion is calcified.   Left Anterior Descending  Mid LAD lesion 20% stenosed  Mid LAD lesion is 20% stenosed.   Left Circumflex  Prox Cx lesion 10% stenosed  Prox Cx lesion is 10% stenosed.   Right Coronary Artery  Vessel is large.  Prox RCA lesion 10% stenosed  Prox RCA lesion is 10% stenosed.  Dist RCA lesion 10% stenosed  Dist RCA lesion is 10% stenosed.  Intervention  No interventions have been documented.                            Coronary Diagrams  Diagnostic  Dominance: Right  &&&&&  Intervention       Implants  No implant documentation for this case.      [employer] Images Link to Procedure Log  Show images for CARDIAC CATHETERIZATION Procedure Log     Images on Long Term Storage   Show images for [place], [place]            [facility] Data    Most Recent Value  Fick Cardiac Output 5.11 L/min  Fick Cardiac Output Index 3.15 (L/min)/BSA  Aortic Mean Gradient 25.62 mmHg  Aortic Peak Gradient 36 mmHg  Aortic Valve Area 0.89  Aortic Value Area Index 0.55 cm2/BSA  RA A Wave 7 mmHg  RA V Wave 6 mmHg  RA Mean 5 mmHg  RV Systolic Pressure 50 mmHg  RV Diastolic Pressure 1 mmHg  RV EDP 6 mmHg  PA Systolic Pressure 47 mmHg  PA Diastolic Pressure 10 mmHg  PA Mean 31 mmHg  PW A Wave 28 mmHg  PW V Wave 44 mmHg  PW Mean 30 mmHg  AO Systolic Pressure 297 mmHg  AO Diastolic Pressure 54 mmHg  AO Mean 78 mmHg  LV Systolic Pressure 989 mmHg  LV Diastolic Pressure 12 mmHg  LV EDP 16 mmHg  AOp Systolic Pressure 211 mmHg  AOp Diastolic Pressure 57 mmHg  AOp Mean Pressure 82 mmHg  LVp Systolic Pressure 941 mmHg  LVp Diastolic Pressure 10 mmHg  LVp EDP Pressure 15 mmHg  QP/QS 1  TPVR Index 9.82 HRUI  TSVR Index 24.74 HRUI  PVR SVR Ratio 0.11  TPVR/TSVR Ratio 0.4     ADDENDUM REPORT: 10/18/2019 21:05  CLINICAL DATA: Severe Aortic Stenosis.  EXAM:  Cardiac TAVR CT  TECHNIQUE:   The patient was scanned on a [employer]. A 120 kV  retrospective scan was triggered in the descending thoracic aorta at  111 HU's. Gantry rotation speed was 250 msecs and collimation was .6  mm. No beta blockade or nitro were given. The 3D data set was  reconstructed in 5% intervals of the R-R cycle. Systolic and  diastolic phases were analyzed on a dedicated work station using  MPR, MIP and VRT modes. The patient received 80 cc of contrast.  FINDINGS:  Image quality: Excellent.  Noise artifact is: Limited.  Valve Morphology: The aortic valve is tricuspid and severely  calcified/thickened with restricted movement in systole. There is no  protruding calcium.  Aortic Valve Area: 1.02 cm2  Aortic Valve Calcium score: 937 AU  Aortic annular dimension:  Phase assessed: 30%  Annular area: 364 mm2  Annular perimeter: 69.1 mm  Max diameter: 23.6 mm  Min diameter: 19.8 mm  Annular and subannular calcification: No significant annular or  sub-annular calcifications.  Optimal coplanar projection: LAO 13 CAU 8  Coronary Artery Height above Annulus:  Left Main: 10.1 mm  Right Coronary: 16.2 mm  Sinus of Valsalva Measurements:  Non-coronary: 27 mm  Right-coronary: 26 mm  Left-coronary: 27 mm  Sinus of Valsalva Height:  Non-coronary: 22 mm  Right-coronary: 18.1 mm  Left-coronary: 18.5 mm  Sinotubular Junction: 24 mm  Ascending Thoracic Aorta: 29 mm  Coronary Arteries: Normal coronary origin. Right dominance. The  study was performed without use of NTG and is insufficient for  plaque evaluation.  Cardiac Morphology:  Right Atrium: Right atrial size is dilated.  Right Ventricle: The right ventricular cavity is within normal  limits.  Left Atrium: Left atrial size is dilated with no left atrial  appendage filling defect.  Left Ventricle: The ventricular cavity size is within normal limits.  There are no stigmata of prior infarction. There is no abnormal  filling defect.  LVEF=77%, no regional wall motion abnormalities.  Pulmonary arteries: Normal in size without proximal filling defect.  Pulmonary veins: Normal pulmonary venous drainage.  Pericardium: Normal thickness with no significant effusion or  calcium present.  Mitral Valve: The mitral valve is normal structure with severe  mitral annular calcification.  Extra-cardiac findings: See attached radiology report for  non-cardiac structures.  IMPRESSION:  1. Severely thickened and calcified aortic valve consistent with  severe aortic stenosis.  2. Annular measurements appropriate for 23 mm Edwards Sapien 3  valve.  3. Borderline left main coronary height to annulus (10.1 mm).  4. Optimal Fluoroscopic Angle for Delivery: LAO 13 CAU 8.  5. Severe mitral annular calcification.  <CurrentUser>, MD  Electronically Signed  By: Eleonore Chiquito  On: 10/18/2019 21:05   Addended by Geralynn Rile, MD on 10/18/2019 9:07 PM  Study Result   EXAM:  OVER-READ INTERPRETATION CT CHEST  The following report is an over-read performed by radiologist Dr.  Vinnie Langton of [facility], [facility] on 10/18/2019. This  over-read does not include interpretation of cardiac or coronary  anatomy or pathology. The coronary calcium score/coronary CTA  interpretation by the cardiologist is attached.  COMPARISON: Chest CT 04/04/2018.  FINDINGS:  Extracardiac findings will be described separately under dictation  for contemporaneously obtained CTA chest, abdomen and pelvis.  IMPRESSION:  Please see separate dictation for contemporaneously obtained CTA  chest, abdomen and pelvis dated 10/18/2019 for full description of  relevant extracardiac findings.  Electronically Signed:  By: Vinnie Langton M.D.  On: 10/18/2019 12:07  CLINICAL DATA: 86 year old female under preprocedural evaluation  prior to potential transcatheter aortic valve replacement (TAVR)  procedure.  EXAM:  CT ANGIOGRAPHY CHEST, ABDOMEN AND PELVIS   TECHNIQUE:  Multidetector CT imaging through the chest, abdomen and pelvis was  performed using the standard protocol during bolus administration of  intravenous contrast. Multiplanar reconstructed images and MIPs were  obtained and reviewed to evaluate the vascular anatomy.  CONTRAST: 174mL OMNIPAQUE IOHEXOL 350 MG/ML SOLN  COMPARISON: Chest CT 04/04/2018.  FINDINGS:  CTA CHEST FINDINGS  Cardiovascular: Heart size is mildly enlarged. There is no  significant pericardial fluid, thickening or pericardial  calcification. There is aortic atherosclerosis, as well as  atherosclerosis of the great vessels of the mediastinum and the  coronary arteries, including calcified atherosclerotic plaque in the  left main, left anterior descending and right coronary arteries.  Thickening calcification of the aortic valve. Severe calcifications  of the mitral annulus and mitral valve.  Mediastinum/Lymph Nodes: No pathologically enlarged mediastinal or  hilar lymph nodes. Moderate hiatal hernia. No axillary  lymphadenopathy.  Lungs/Pleura: Small pulmonary  nodules are noted in the lungs,  largest of which is in the left upper lobe (axial image 28 of series  8), measuring 6 x 6 mm. In addition, there is a ground-glass  attenuation 1.6 x 1.0 cm lesion in the left upper lobe (axial image  34 of series 8). Patchy areas of ground-glass attenuation and  interlobular septal thickening noted throughout the lungs  bilaterally. No acute consolidative airspace disease. No pleural  effusions.  Musculoskeletal/Soft Tissues: There are no aggressive appearing  lytic or blastic lesions noted in the visualized portions of the  skeleton.  CTA ABDOMEN AND PELVIS FINDINGS  Hepatobiliary: No suspicious cystic or solid hepatic lesions. No  intra or extrahepatic biliary ductal dilatation. Status post  cholecystectomy.  Pancreas: In the head of the pancreas (axial image 114 of series 7  and coronal image 66 of series 9)  there is a 1.3 x 0.9 x 1.2 cm  low-attenuation lesion in close proximity to the common bile duct.  No other pancreatic mass. No pancreatic ductal dilatation. No  peripancreatic fluid collections or inflammatory changes.  Spleen: Unremarkable.  Adrenals/Urinary Tract: Bilateral kidneys and bilateral adrenal  glands are normal in appearance. No hydroureteronephrosis. Small  amount of gas non dependently in the urinary bladder. Otherwise,  urinary bladder is normal in appearance.  Stomach/Bowel: Normal appearance of the stomach. No pathologic  dilatation of small bowel or colon. Numerous colonic diverticulae  are noted, particularly in the sigmoid colon, without surrounding  inflammatory changes to suggest an acute diverticulitis at this  time. The appendix is not confidently identified and may be  surgically absent. Regardless, there are no inflammatory changes  noted adjacent to the cecum to suggest the presence of an acute  appendicitis at this time.  Vascular/Lymphatic: Aortic atherosclerosis, with vascular findings  and measurements pertinent to potential TAVR procedure, as detailed  below. No aneurysm or dissection noted in the abdominal or pelvic  vasculature. No lymphadenopathy noted in the abdomen or pelvis.  Reproductive: Status post hysterectomy. Ovaries are not confidently  identified may be surgically absent or trophic.  Other: No significant volume of ascites. No pneumoperitoneum.  Musculoskeletal: There are no aggressive appearing lytic or blastic  lesions noted in the visualized portions of the skeleton.  VASCULAR MEASUREMENTS PERTINENT TO TAVR:  AORTA:  Minimal Aortic Diameter-13 x 10 mm  Severity of Aortic Calcification-moderate to severe  RIGHT PELVIS:  Right Common Iliac Artery -  Minimal Diameter-8.3 x 7.9 mm  Tortuosity-mild  Calcification-mild  Right External Iliac Artery -  Minimal Diameter-5.9 x 6.6 mm  Tortuosity-mild-to-moderate  Calcification-none   Right Common Femoral Artery -  Minimal Diameter-6.6 x 6.7 mm  Tortuosity-mild  Calcification-mild  LEFT PELVIS:  Left Common Iliac Artery -  Minimal Diameter-9.2 x 8.6 mm  Tortuosity-mild  Calcification-mild  Left External Iliac Artery -  Minimal Diameter-6.1 x 6.1 mm  Tortuosity-mild-to-moderate  Calcification-none  Left Common Femoral Artery -  Minimal Diameter-6.7 x 6.9 mm  Tortuosity-mild  Calcification-mild  Review of the MIP images confirms the above findings.  IMPRESSION:  1. Vascular findings and measurements pertinent to potential TAVR  procedure, as detailed above.  2. Thickening calcification of the aortic valve, compatible with the  reported clinical history of severe aortic stenosis.  3. Severe calcifications also noted of the mitral annulus and mitral  valve.  4. Aortic atherosclerosis, in addition to left main and 2 vessel  coronary artery disease.  5. Small pulmonary nodules in the lungs, largest of which is  a  ground-glass attenuation nodule in the left upper lobe (1.6 x 1.0  cm). Initial follow-up with CT at 6-12 months is recommended to  confirm persistence. If persistent, repeat CT is recommended every 2  years until 5 years of stability has been established. This  recommendation follows the consensus statement: Guidelines for  Management of Incidental Pulmonary Nodules Detected on CT Images:  From the Fleischner Society 2017; Radiology 2017; 284:228-243.  6. The appearance of the lungs suggests mild interstitial pulmonary  edema. Mild cardiomegaly. Imaging findings suggestive of mild  congestive heart failure.  7. 1.3 x 0.9 x 1.2 cm low-attenuation lesion in the head of the  pancreas. This is nonspecific, and may simply represent a tiny  pancreatic pseudocyst. However, attention on follow-up studies is  recommended. Follow-up abdominal MRI with and without IV gadolinium  with MRCP or pancreatic protocol CT scan is recommended in 2 months  to ensure  the stability of this finding. This recommendation follows  ACR consensus guidelines: Management of Incidental Pancreatic Cysts:  A White Paper of the ACR Incidental Findings Committee. J Am Coll  Radiol 5573;22:025-427.  8. Severe colonic diverticulosis without evidence of acute  diverticulitis at this time.  9. Additional incidental findings, as above.  Electronically Signed  By: Vinnie Langton M.D.  On: 10/18/2019 14:23   Procedure: Isolated AVR   Risk of Mortality: 3.536%  Renal Failure: 3.178%  Permanent Stroke: 1.607%  Prolonged Ventilation: 9.218%  DSW Infection: 0.132%  Reoperation: 2.680%  Morbidity or Mortality: 13.620%  Short Length of Stay: 25.372%  Long Length of Stay: 7.820%   Impression:   This 86 year old woman has stage D, severe, symptomatic aortic stenosis with New York Heart Association class III symptoms of exertional fatigue and shortness of breath consistent with chronic diastolic congestive heart failure. I have personally reviewed her 2D echocardiogram, cardiac catheterization, and CTA studies. Her echocardiogram shows a calcified aortic valve with restricted mobility. The mean gradient was measured at 48 mmHg with an aortic valve area of 0.8 cm and a dimensionless index of 0.26 consistent with severe aortic stenosis. Left ventricular systolic function is normal. In addition she probably has moderate mitral valve stenosis with a mean gradient of 17.5 mmHg and a peak gradient of 30 mmHg. Cardiac catheterization showed mild nonobstructive coronary disease with a mean gradient measured at 25.6 mmHg and peak to peak gradient of 36 mmHg. Aortic valve area was measured at 0.89 cm. I agree that aortic valve replacement is indicated in this patient with progressive symptoms to improve her functional status and prevent progressive left ventricular deterioration. She also has what is probably moderate mitral stenosis but I do not think that is driving her current  progressive symptoms. I think she would be a high risk candidate for open surgical aortic valve replacement +/- mitral valve replacement. I think transcatheter aortic valve replacement would be the best treatment for her. Her gated cardiac CTA shows anatomy suitable for transcatheter aortic valve replacement using a SAPIEN 3 valve. Her abdominal and pelvic CTA shows adequate pelvic vascular anatomy to allow transfemoral insertion.  The patient was counseled at length regarding treatment alternatives for management of severe symptomatic aortic stenosis. The risks and benefits of surgical intervention has been discussed in detail. Long-term prognosis with medical therapy was discussed. Alternative approaches such as conventional surgical aortic valve replacement, transcatheter aortic valve replacement, and palliative medical therapy were compared and contrasted at length. This discussion was placed in the context of the patient's own specific  clinical presentation and past medical history. All of her questions have been addressed.  Following the decision to proceed with transcatheter aortic valve replacement, a discussion was held regarding what types of management strategies would be attempted intraoperatively in the event of life-threatening complications, including whether or not the patient would be considered a candidate for the use of cardiopulmonary bypass and/or conversion to open sternotomy for attempted surgical intervention. The patient is aware of the fact that transient use of cardiopulmonary bypass may be necessary. I think she would be a candidate for emergent sternotomy to manage any intraoperative complications although her operative risk would obviously be high.  The patient has been advised of a variety of complications that might develop including but not limited to risks of death, stroke, paravalvular leak, aortic dissection or other major vascular complications, aortic annulus rupture, device  embolization, cardiac rupture or perforation, mitral regurgitation, acute myocardial infarction, arrhythmia, heart block or bradycardia requiring permanent pacemaker placement, congestive heart failure, respiratory failure, renal failure, pneumonia, infection, other late complications related to structural valve deterioration or migration, or other complications that might ultimately cause a temporary or permanent loss of functional independence or other long term morbidity. The patient provides full informed consent for the procedure as described and all questions were answered.   Plan:   Transfemoral transcatheter aortic valve replacement.   Gaye Pollack, MD

## 2019-11-06 ENCOUNTER — Inpatient Hospital Stay (HOSPITAL_COMMUNITY): Payer: Medicare Other | Admitting: Physician Assistant

## 2019-11-06 ENCOUNTER — Other Ambulatory Visit: Payer: Self-pay

## 2019-11-06 ENCOUNTER — Telehealth: Payer: Self-pay | Admitting: Physician Assistant

## 2019-11-06 ENCOUNTER — Encounter (HOSPITAL_COMMUNITY)
Admission: RE | Disposition: A | Discharge status: Home / Self Care | Payer: Medicare Other | Source: Home / Self Care | Attending: Surgery

## 2019-11-06 ENCOUNTER — Ambulatory Visit (HOSPITAL_COMMUNITY)
Admission: RE | Admit: 2019-11-06 | Discharge: 2019-11-06 | Disposition: A | Discharge status: Home / Self Care | Payer: Medicare Other | Source: Home / Self Care | Attending: Physician Assistant | Admitting: Physician Assistant

## 2019-11-06 ENCOUNTER — Inpatient Hospital Stay (HOSPITAL_COMMUNITY): Payer: Medicare Other | Admitting: Anesthesiology

## 2019-11-06 ENCOUNTER — Other Ambulatory Visit: Payer: Self-pay | Admitting: Physician Assistant

## 2019-11-06 ENCOUNTER — Inpatient Hospital Stay (HOSPITAL_COMMUNITY)
Admission: RE | Admit: 2019-11-06 | Discharge: 2019-11-08 | DRG: 266 | Disposition: A | Discharge status: Home / Self Care | Payer: Medicare Other | Attending: Surgery | Admitting: Surgery

## 2019-11-06 ENCOUNTER — Encounter (HOSPITAL_COMMUNITY): Payer: Self-pay | Admitting: Cardiovascular Disease

## 2019-11-06 DIAGNOSIS — Z952 Presence of prosthetic heart valve: Secondary | ICD-10-CM | POA: Diagnosis not present

## 2019-11-06 DIAGNOSIS — I5033 Acute on chronic diastolic (congestive) heart failure: Secondary | ICD-10-CM

## 2019-11-06 DIAGNOSIS — I2510 Atherosclerotic heart disease of native coronary artery without angina pectoris: Secondary | ICD-10-CM | POA: Diagnosis not present

## 2019-11-06 DIAGNOSIS — K219 Gastro-esophageal reflux disease without esophagitis: Secondary | ICD-10-CM | POA: Diagnosis not present

## 2019-11-06 DIAGNOSIS — Z8249 Family history of ischemic heart disease and other diseases of the circulatory system: Secondary | ICD-10-CM | POA: Diagnosis not present

## 2019-11-06 DIAGNOSIS — I130 Hypertensive heart and chronic kidney disease with heart failure and stage 1 through stage 4 chronic kidney disease, or unspecified chronic kidney disease: Secondary | ICD-10-CM | POA: Diagnosis present

## 2019-11-06 DIAGNOSIS — N183 Chronic kidney disease, stage 3 unspecified: Secondary | ICD-10-CM | POA: Diagnosis not present

## 2019-11-06 DIAGNOSIS — Z820 Family history of epilepsy and other diseases of the nervous system: Secondary | ICD-10-CM

## 2019-11-06 DIAGNOSIS — R112 Nausea with vomiting, unspecified: Secondary | ICD-10-CM | POA: Diagnosis present

## 2019-11-06 DIAGNOSIS — I059 Rheumatic mitral valve disease, unspecified: Secondary | ICD-10-CM | POA: Diagnosis not present

## 2019-11-06 DIAGNOSIS — Z006 Encounter for examination for normal comparison and control in clinical research program: Secondary | ICD-10-CM | POA: Diagnosis not present

## 2019-11-06 DIAGNOSIS — I350 Nonrheumatic aortic (valve) stenosis: Secondary | ICD-10-CM | POA: Diagnosis not present

## 2019-11-06 DIAGNOSIS — Z85048 Personal history of other malignant neoplasm of rectum, rectosigmoid junction, and anus: Secondary | ICD-10-CM | POA: Diagnosis not present

## 2019-11-06 DIAGNOSIS — E785 Hyperlipidemia, unspecified: Secondary | ICD-10-CM | POA: Diagnosis not present

## 2019-11-06 DIAGNOSIS — Z954 Presence of other heart-valve replacement: Secondary | ICD-10-CM | POA: Diagnosis not present

## 2019-11-06 DIAGNOSIS — R7303 Prediabetes: Secondary | ICD-10-CM | POA: Diagnosis present

## 2019-11-06 DIAGNOSIS — I050 Rheumatic mitral stenosis: Secondary | ICD-10-CM | POA: Diagnosis present

## 2019-11-06 HISTORY — DX: Chronic kidney disease, stage 3 unspecified: N18.30

## 2019-11-06 HISTORY — DX: Rheumatic mitral stenosis: I05.0

## 2019-11-06 HISTORY — DX: Presence of prosthetic heart valve: Z95.2

## 2019-11-06 HISTORY — PX: TRANSCATHETER AORTIC VALVE REPLACEMENT, TRANSFEMORAL: SHX6400

## 2019-11-06 HISTORY — PX: TEE WITHOUT CARDIOVERSION: SHX5443

## 2019-11-06 HISTORY — DX: Acute on chronic diastolic (congestive) heart failure: I50.33

## 2019-11-06 LAB — POCT I-STAT, CHEM 8
BUN: 17 mg/dL (ref 8–23)
BUN: 17 mg/dL (ref 8–23)
BUN: 17 mg/dL (ref 8–23)
BUN: 17 mg/dL (ref 8–23)
Calcium, Ion: 1.24 mmol/L (ref 1.15–1.40)
Calcium, Ion: 1.29 mmol/L (ref 1.15–1.40)
Calcium, Ion: 1.29 mmol/L (ref 1.15–1.40)
Calcium, Ion: 1.24 mmol/L (ref 1.15–1.40)
Chloride: 96 mmol/L — ABNORMAL LOW (ref 98–111)
Chloride: 98 mmol/L (ref 98–111)
Chloride: 98 mmol/L (ref 98–111)
Chloride: 99 mmol/L (ref 98–111)
Creatinine, Ser: 0.9 mg/dL (ref 0.44–1.00)
Creatinine, Ser: 0.9 mg/dL (ref 0.44–1.00)
Creatinine, Ser: 0.9 mg/dL (ref 0.44–1.00)
Creatinine, Ser: 0.9 mg/dL (ref 0.44–1.00)
Glucose, Bld: 120 mg/dL — ABNORMAL HIGH (ref 70–99)
Glucose, Bld: 128 mg/dL — ABNORMAL HIGH (ref 70–99)
Glucose, Bld: 124 mg/dL — ABNORMAL HIGH (ref 70–99)
Glucose, Bld: 110 mg/dL — ABNORMAL HIGH (ref 70–99)
HCT: 28 % — ABNORMAL LOW (ref 36.0–46.0)
HCT: 30 % — ABNORMAL LOW (ref 36.0–46.0)
HCT: 28 % — ABNORMAL LOW (ref 36.0–46.0)
HCT: 27 % — ABNORMAL LOW (ref 36.0–46.0)
Hemoglobin: 10.2 g/dL — ABNORMAL LOW (ref 12.0–15.0)
Hemoglobin: 9.5 g/dL — ABNORMAL LOW (ref 12.0–15.0)
Hemoglobin: 9.5 g/dL — ABNORMAL LOW (ref 12.0–15.0)
Hemoglobin: 9.2 g/dL — ABNORMAL LOW (ref 12.0–15.0)
Potassium: 3.8 mmol/L (ref 3.5–5.1)
Potassium: 3.9 mmol/L (ref 3.5–5.1)
Potassium: 4 mmol/L (ref 3.5–5.1)
Potassium: 4 mmol/L (ref 3.5–5.1)
Sodium: 135 mmol/L (ref 135–145)
Sodium: 136 mmol/L (ref 135–145)
Sodium: 135 mmol/L (ref 135–145)
Sodium: 135 mmol/L (ref 135–145)
TCO2: 27 mmol/L (ref 22–32)
TCO2: 28 mmol/L (ref 22–32)
TCO2: 27 mmol/L (ref 22–32)
TCO2: 27 mmol/L (ref 22–32)

## 2019-11-06 LAB — GLUCOSE, CAPILLARY: Glucose-Capillary: 115 mg/dL — ABNORMAL HIGH (ref 70–99)

## 2019-11-06 LAB — POCT I-STAT 7, (LYTES, BLD GAS, ICA,H+H)
Bicarbonate: 26 mmol/L (ref 20.0–28.0)
Calcium, Ion: 1.26 mmol/L (ref 1.15–1.40)
HCT: 29 % — ABNORMAL LOW (ref 36.0–46.0)
Hemoglobin: 9.9 g/dL — ABNORMAL LOW (ref 12.0–15.0)
O2 Saturation: 94 %
Potassium: 4 mmol/L (ref 3.5–5.1)
Sodium: 136 mmol/L (ref 135–145)
TCO2: 27 mmol/L (ref 22–32)
pCO2 arterial: 47.6 mmHg (ref 32.0–48.0)
pH, Arterial: 7.345 — ABNORMAL LOW (ref 7.350–7.450)
pO2, Arterial: 78 mmHg — ABNORMAL LOW (ref 83.0–108.0)

## 2019-11-06 LAB — TYPE AND SCREEN
ABO/RH(D): A NEG
Antibody Screen: POSITIVE

## 2019-11-06 SURGERY — IMPLANTATION, AORTIC VALVE, TRANSCATHETER, FEMORAL APPROACH
Anesthesia: Monitor Anesthesia Care

## 2019-11-06 MED ORDER — IOHEXOL 350 MG/ML SOLN
INTRAVENOUS | Status: DC | PRN
  Administered 2019-11-06: 60 mL

## 2019-11-06 MED ORDER — MONTELUKAST SODIUM 10 MG PO TABS: 10.0000 mg | ORAL_TABLET | Freq: Every day | ORAL | Status: DC | PRN

## 2019-11-06 MED ORDER — PANTOPRAZOLE SODIUM 40 MG PO TBEC
40.0000 mg | DELAYED_RELEASE_TABLET | Freq: Every day | ORAL | Status: DC
  Administered 2019-11-06 – 2019-11-08 (×3): 40 mg via ORAL
  Filled 2019-11-06 (×3): qty 1

## 2019-11-06 MED ORDER — LIDOCAINE 2% (20 MG/ML) 5 ML SYRINGE
INTRAMUSCULAR | Status: DC | PRN
  Administered 2019-11-06: 40 mg via INTRAVENOUS

## 2019-11-06 MED ORDER — FERROUS GLUCONATE 240 (27 FE) MG PO TABS: 27.0000 mg | ORAL_TABLET | Freq: Every day | ORAL | Status: DC

## 2019-11-06 MED ORDER — HEPARIN (PORCINE) IN NACL 1000-0.9 UT/500ML-% IV SOLN
INTRAVENOUS | Status: AC
  Filled 2019-11-06: qty 1500

## 2019-11-06 MED ORDER — CHLORHEXIDINE GLUCONATE 4 % EX LIQD
60.0000 mL | Freq: Once | CUTANEOUS | Status: DC
  Filled 2019-11-06: qty 60

## 2019-11-06 MED ORDER — SODIUM CHLORIDE 0.9 % IV SOLN: INTRAVENOUS | Status: AC

## 2019-11-06 MED ORDER — HEPARIN (PORCINE) IN NACL 1000-0.9 UT/500ML-% IV SOLN
INTRAVENOUS | Status: DC | PRN
  Administered 2019-11-06 (×3): 500 mL

## 2019-11-06 MED ORDER — OXYCODONE HCL 5 MG PO TABS: 5.0000 mg | ORAL_TABLET | ORAL | Status: DC | PRN

## 2019-11-06 MED ORDER — SODIUM CHLORIDE 0.9% FLUSH: 3.0000 mL | INTRAVENOUS | Status: DC | PRN

## 2019-11-06 MED ORDER — ACETAMINOPHEN 325 MG PO TABS: 650.0000 mg | ORAL_TABLET | Freq: Four times a day (QID) | ORAL | Status: DC | PRN

## 2019-11-06 MED ORDER — ONDANSETRON HCL 4 MG/2ML IJ SOLN
4.0000 mg | Freq: Four times a day (QID) | INTRAMUSCULAR | Status: DC | PRN
  Administered 2019-11-07: 05:00:00 4 mg via INTRAVENOUS
  Filled 2019-11-06 (×2): qty 2

## 2019-11-06 MED ORDER — HEPARIN SODIUM (PORCINE) 1000 UNIT/ML IJ SOLN
INTRAMUSCULAR | Status: DC | PRN
  Administered 2019-11-06: 10000 [IU] via INTRAVENOUS

## 2019-11-06 MED ORDER — SODIUM CHLORIDE 0.9 % IV SOLN: 250.0000 mL | INTRAVENOUS | Status: DC | PRN

## 2019-11-06 MED ORDER — PHENYLEPHRINE HCL (PRESSORS) 10 MG/ML IV SOLN: INTRAVENOUS | Status: DC | PRN

## 2019-11-06 MED ORDER — PROTAMINE SULFATE 10 MG/ML IV SOLN
INTRAVENOUS | Status: DC | PRN
  Administered 2019-11-06: 40 mg via INTRAVENOUS
  Administered 2019-11-06: 10 mg via INTRAVENOUS
  Administered 2019-11-06: 20 mg via INTRAVENOUS
  Administered 2019-11-06: 30 mg via INTRAVENOUS

## 2019-11-06 MED ORDER — LIDOCAINE HCL (PF) 1 % IJ SOLN
INTRAMUSCULAR | Status: AC
  Filled 2019-11-06: qty 30

## 2019-11-06 MED ORDER — NITROGLYCERIN IN D5W 200-5 MCG/ML-% IV SOLN: 0.0000 ug/min | INTRAVENOUS | Status: DC

## 2019-11-06 MED ORDER — EZETIMIBE 10 MG PO TABS
10.0000 mg | ORAL_TABLET | Freq: Every evening | ORAL | Status: DC
  Administered 2019-11-06 – 2019-11-07 (×2): 10 mg via ORAL
  Filled 2019-11-06 (×2): qty 1

## 2019-11-06 MED ORDER — FENTANYL CITRATE (PF) 250 MCG/5ML IJ SOLN
INTRAMUSCULAR | Status: DC | PRN
  Administered 2019-11-06: 50 ug via INTRAVENOUS

## 2019-11-06 MED ORDER — CHLORHEXIDINE GLUCONATE 0.12 % MT SOLN
15.0000 mL | Freq: Once | OROMUCOSAL | Status: AC
  Administered 2019-11-06: 15 mL via OROMUCOSAL
  Filled 2019-11-06 (×2): qty 15

## 2019-11-06 MED ORDER — CLOPIDOGREL BISULFATE 75 MG PO TABS
75.0000 mg | ORAL_TABLET | Freq: Every day | ORAL | Status: DC
  Administered 2019-11-07 – 2019-11-08 (×2): 75 mg via ORAL
  Filled 2019-11-06 (×2): qty 1

## 2019-11-06 MED ORDER — PRAVASTATIN SODIUM 40 MG PO TABS
40.0000 mg | ORAL_TABLET | ORAL | Status: DC
  Administered 2019-11-07: 17:00:00 40 mg via ORAL
  Filled 2019-11-06: qty 1

## 2019-11-06 MED ORDER — ACETAMINOPHEN 650 MG RE SUPP: 650.0000 mg | Freq: Four times a day (QID) | RECTAL | Status: DC | PRN

## 2019-11-06 MED ORDER — SODIUM CHLORIDE 0.9% FLUSH
3.0000 mL | Freq: Two times a day (BID) | INTRAVENOUS | Status: DC
  Administered 2019-11-06 – 2019-11-08 (×4): 3 mL via INTRAVENOUS

## 2019-11-06 MED ORDER — PHENYLEPHRINE HCL (PRESSORS) 10 MG/ML IV SOLN
INTRAVENOUS | Status: DC | PRN
  Administered 2019-11-06: 80 ug via INTRAVENOUS

## 2019-11-06 MED ORDER — SODIUM CHLORIDE 0.9 % IV SOLN
1.5000 g | Freq: Two times a day (BID) | INTRAVENOUS | Status: AC
  Administered 2019-11-06 – 2019-11-08 (×4): 1.5 g via INTRAVENOUS
  Filled 2019-11-06 (×4): qty 1.5

## 2019-11-06 MED ORDER — TRAMADOL HCL 50 MG PO TABS
50.0000 mg | ORAL_TABLET | ORAL | Status: DC | PRN
  Administered 2019-11-06 – 2019-11-07 (×2): 50 mg via ORAL
  Filled 2019-11-06 (×2): qty 1

## 2019-11-06 MED ORDER — SODIUM CHLORIDE 0.9 % IV SOLN: INTRAVENOUS | Status: DC

## 2019-11-06 MED ORDER — IOHEXOL 350 MG/ML SOLN
INTRAVENOUS | Status: AC
  Filled 2019-11-06: qty 1

## 2019-11-06 MED ORDER — ASPIRIN EC 81 MG PO TBEC
81.0000 mg | DELAYED_RELEASE_TABLET | Freq: Every day | ORAL | Status: DC
  Administered 2019-11-06 – 2019-11-08 (×3): 81 mg via ORAL
  Filled 2019-11-06 (×3): qty 1

## 2019-11-06 MED ORDER — FENOFIBRATE 54 MG PO TABS
54.0000 mg | ORAL_TABLET | Freq: Every day | ORAL | Status: DC
  Administered 2019-11-06 – 2019-11-07 (×2): 54 mg via ORAL
  Filled 2019-11-06 (×2): qty 1

## 2019-11-06 MED ORDER — PHENYLEPHRINE HCL-NACL 20-0.9 MG/250ML-% IV SOLN
0.0000 ug/min | INTRAVENOUS | Status: DC
  Filled 2019-11-06: qty 250

## 2019-11-06 MED ORDER — CHLORHEXIDINE GLUCONATE 4 % EX LIQD
30.0000 mL | CUTANEOUS | Status: DC
  Filled 2019-11-06: qty 30

## 2019-11-06 MED ORDER — VANCOMYCIN HCL IN DEXTROSE 1-5 GM/200ML-% IV SOLN
1000.0000 mg | Freq: Once | INTRAVENOUS | Status: AC
  Administered 2019-11-06: 1000 mg via INTRAVENOUS
  Filled 2019-11-06: qty 200

## 2019-11-06 MED ORDER — PHENYLEPHRINE HCL-NACL 10-0.9 MG/250ML-% IV SOLN
INTRAVENOUS | Status: DC | PRN
  Administered 2019-11-06: 25 ug/min via INTRAVENOUS

## 2019-11-06 MED ORDER — LIDOCAINE HCL (PF) 1 % IJ SOLN
INTRAMUSCULAR | Status: DC | PRN
  Administered 2019-11-06: 15 mL
  Administered 2019-11-06: 10 mL

## 2019-11-06 MED ORDER — LACTATED RINGERS IV SOLN: INTRAVENOUS | Status: DC

## 2019-11-06 MED ORDER — MORPHINE SULFATE (PF) 2 MG/ML IV SOLN: 1.0000 mg | INTRAVENOUS | Status: DC | PRN

## 2019-11-06 MED ORDER — PROPOFOL 500 MG/50ML IV EMUL
INTRAVENOUS | Status: DC | PRN
  Administered 2019-11-06: 10 ug/kg/min via INTRAVENOUS

## 2019-11-06 MED ORDER — FERROUS GLUCONATE 324 (38 FE) MG PO TABS
324.0000 mg | ORAL_TABLET | Freq: Every day | ORAL | Status: DC
  Administered 2019-11-07 – 2019-11-08 (×2): 324 mg via ORAL
  Filled 2019-11-06 (×2): qty 1

## 2019-11-06 SURGICAL SUPPLY — 33 items
BAG SNAP BAND KOVER 36X36 (MISCELLANEOUS) ×6 IMPLANT
CABLE ADAPT PACING TEMP 12FT (ADAPTER) ×2 IMPLANT
CATH 23 ULTRA DELIVERY (CATHETERS) ×2 IMPLANT
CATH DIAG 6FR PIGTAIL ANGLED (CATHETERS) ×4 IMPLANT
CATH INFINITI 5FR AL1 (CATHETERS) ×2 IMPLANT
CATH S G BIP PACING (CATHETERS) ×2 IMPLANT
CLOSURE MYNX CONTROL 6F/7F (Vascular Products) ×2 IMPLANT
CRIMPER (MISCELLANEOUS) ×2 IMPLANT
DEVICE CLOSURE PERCLS PRGLD 6F (VASCULAR PRODUCTS) IMPLANT
DEVICE INFLATION ATRION QL2530 (MISCELLANEOUS) ×2 IMPLANT
GUIDEWIRE SAFE TJ AMPLATZ EXST (WIRE) ×2 IMPLANT
KIT HEART LEFT (KITS) ×3 IMPLANT
KIT MICROPUNCTURE NIT STIFF (SHEATH) ×2 IMPLANT
PACK CARDIAC CATHETERIZATION (CUSTOM PROCEDURE TRAY) ×3 IMPLANT
PERCLOSE PROGLIDE 6F (VASCULAR PRODUCTS) ×6
SHEATH 14X36 EDWARDS (SHEATH) ×2 IMPLANT
SHEATH BRITE TIP 7FR 35CM (SHEATH) ×2 IMPLANT
SHEATH PINNACLE 6F 10CM (SHEATH) ×2 IMPLANT
SHEATH PINNACLE 8F 10CM (SHEATH) ×2 IMPLANT
SHEATH PROBE COVER 6X72 (BAG) ×4 IMPLANT
SLEEVE REPOSITIONING LENGTH 30 (MISCELLANEOUS) ×2 IMPLANT
STOPCOCK MORSE 400PSI 3WAY (MISCELLANEOUS) ×6 IMPLANT
SYR CONTROL 10ML ANGIOGRAPHIC (SYRINGE) ×2 IMPLANT
SYR MEDRAD MARK V 150ML (SYRINGE) ×2 IMPLANT
TRANSDUCER W/STOPCOCK (MISCELLANEOUS) ×6 IMPLANT
TUBE CONN 8.8X1320 FR HP M-F (CONNECTOR) ×2 IMPLANT
TUBING ART PRESS 72  MALE/FEM (TUBING) ×3
TUBING ART PRESS 72 MALE/FEM (TUBING) IMPLANT
VALVE 23 ULTRA SAPIEN KIT (Valve) ×2 IMPLANT
WIRE AMPLATZ SS-J .035X180CM (WIRE) ×2 IMPLANT
WIRE EMERALD 3MM-J .035X150CM (WIRE) ×2 IMPLANT
WIRE EMERALD 3MM-J .035X260CM (WIRE) ×2 IMPLANT
WIRE EMERALD ST .035X260CM (WIRE) ×2 IMPLANT

## 2019-11-06 NOTE — Telephone Encounter (Signed)
  [facility]   MULTIDISCIPLINARY HEART VALVE TEAM  Patient doing well s/p TAVR. She is hemodynamically stable. Groin sites stable. ECG with new LBBB but no high grade block. Arterial line discontinued and transferred  to 4E. Plan for early ambulation after bedrest completed and hopeful discharge over the next 24-48 hours.   Slate Debroux PA-C  [facility]  Pager [phone]  

## 2019-11-06 NOTE — Interval H&P Note (Signed)
History and Physical Interval Note:  11/06/2019 8:00 AM  Hannah Briggs  has presented today for surgery, with the diagnosis of Severe aortic stenosis.  The various methods of treatment have been discussed with the patient and family. After consideration of risks, benefits and other options for treatment, the patient has consented to  Procedure(s): TRANSCATHETER AORTIC VALVE REPLACEMENT, TRANSFEMORAL (N/A) TRANSESOPHAGEAL ECHOCARDIOGRAM (TEE) (N/A) as a surgical intervention.  The patient's history has been reviewed, patient examined, no change in status, stable for surgery.  I have reviewed the patient's chart and labs.  Questions were answered to the patient's satisfaction.     Gaye Pollack

## 2019-11-06 NOTE — Anesthesia Preprocedure Evaluation (Signed)
Anesthesia Evaluation  Patient identified by MRN, date of birth, ID band Patient awake    Reviewed: Allergy & Precautions, NPO status , Patient's Chart, lab work & pertinent test results  Airway Mallampati: II  TM Distance: >3 FB     Dental   Pulmonary    breath sounds clear to auscultation       Cardiovascular  Rhythm:Regular Rate:Normal + Systolic murmurs    Neuro/Psych negative neurological ROS     GI/Hepatic Neg liver ROS, GERD  ,  Endo/Other    Renal/GU Renal disease     Musculoskeletal  (+) Arthritis ,   Abdominal   Peds  Hematology   Anesthesia Other Findings   Reproductive/Obstetrics                             Anesthesia Physical Anesthesia Plan  ASA: III  Anesthesia Plan: MAC   Post-op Pain Management:    Induction: Intravenous  PONV Risk Score and Plan: 2 and Ondansetron, Dexamethasone and Midazolam  Airway Management Planned: Oral ETT  Additional Equipment:   Intra-op Plan:   Post-operative Plan:   Informed Consent: I have reviewed the patients History and Physical, chart, labs and discussed the procedure including the risks, benefits and alternatives for the proposed anesthesia with the patient or authorized representative who has indicated his/her understanding and acceptance.     Dental advisory given  Plan Discussed with: CRNA and Anesthesiologist  Anesthesia Plan Comments:         Anesthesia Quick Evaluation

## 2019-11-06 NOTE — Progress Notes (Signed)
Pt ambulated 470 feet with minimal assist. Pt tolerated well.  Bilateral groins level 0 after walk. Will continue to monitor.  Jerald Kief, RN

## 2019-11-06 NOTE — Transfer of Care (Signed)
Immediate Anesthesia Transfer of Care Note  Patient: RONNEISHA JETT  Procedure(s) Performed: TRANSCATHETER AORTIC VALVE REPLACEMENT, TRANSFEMORAL (N/A ) TRANSESOPHAGEAL ECHOCARDIOGRAM (TEE) (N/A )  Patient Location: Cath Lab  Anesthesia Type:MAC  Level of Consciousness: awake, alert  and oriented  Airway & Oxygen Therapy: Patient Spontanous Breathing and Patient connected to nasal cannula oxygen  Post-op Assessment: Report given to RN, Post -op Vital signs reviewed and stable and Patient moving all extremities X 4  Post vital signs: Reviewed and stable  Last Vitals:  Vitals Value Taken Time  BP 65/52 11/06/19 1118  Temp    Pulse 74 11/06/19 1118  Resp 18 11/06/19 1118  SpO2 96 % 11/06/19 1118  Vitals shown include unvalidated device data.  Last Pain:  Vitals:   11/06/19 0740  PainSc: 0-No pain      Patients Stated Pain Goal: 2 (45/36/46 8032)  Complications: No apparent anesthesia complications

## 2019-11-06 NOTE — Progress Notes (Signed)
Pt received from cath lab. C/A/Xox4. CHG bath given. Tele 25 applied/ccmd notified. Vitals stable. Bilateral groins level 0. Pt denies complaints. Pt oriented to room and call bell within reach. Will continue to monitor.  Jerald Kief, RN

## 2019-11-06 NOTE — Op Note (Signed)
[facility]   MULTIDISCIPLINARY HEART VALVE TEAM   TAVR OPERATIVE NOTE   Date of Procedure:  11/06/2019  Preoperative Diagnosis: Severe Aortic Stenosis   Postoperative Diagnosis: Same   Procedure:    Transcatheter Aortic Valve Replacement - Percutaneous Right Transfemoral Approach  Edwards Sapien 3 Ultra THV (size 23 mm, model # 9750TFX, serial # [id number])   Co-Surgeons:  Gaye Pollack, MD and Lauree Chandler, MD   Anesthesiologist:  Belinda Block, MD  Echocardiographer:  Jenkins Rouge, MD  Pre-operative Echo Findings:  Severe aortic stenosis  Normal left ventricular systolic function  Post-operative Echo Findings:  No paravalvular leak  Normal left ventricular systolic function   BRIEF CLINICAL NOTE AND INDICATIONS FOR SURGERY  This 86 year old woman has stage D, severe, symptomatic aortic stenosis with New York Heart Association class III symptoms of exertional fatigue and shortness of breath consistent with chronic diastolic congestive heart failure. I have personally reviewed her 2D echocardiogram, cardiac catheterization, and CTA studies. Her echocardiogram shows a calcified aortic valve with restricted mobility. The mean gradient was measured at 48 mmHg with an aortic valve area of 0.8 cm and a dimensionless index of 0.26 consistent with severe aortic stenosis. Left ventricular systolic function is normal. In addition she probably has moderate mitral valve stenosis with a mean gradient of 17.5 mmHg and a peak gradient of 30 mmHg. Cardiac catheterization showed mild nonobstructive coronary disease with a mean gradient measured at 25.6 mmHg and peak to peak gradient of 36 mmHg. Aortic valve area was measured at 0.89 cm. I agree that aortic valve replacement is indicated in this patient with progressive symptoms to improve her functional status and prevent progressive left ventricular deterioration. She also has what is probably moderate mitral  stenosis but I do not think that is driving her current progressive symptoms. I think she would be a high risk candidate for open surgical aortic valve replacement +/- mitral valve replacement. I think transcatheter aortic valve replacement would be the best treatment for her. Her gated cardiac CTA shows anatomy suitable for transcatheter aortic valve replacement using a SAPIEN 3 valve. Her abdominal and pelvic CTA shows adequate pelvic vascular anatomy to allow transfemoral insertion.  The patient was counseled at length regarding treatment alternatives for management of severe symptomatic aortic stenosis. The risks and benefits of surgical intervention has been discussed in detail. Long-term prognosis with medical therapy was discussed. Alternative approaches such as conventional surgical aortic valve replacement, transcatheter aortic valve replacement, and palliative medical therapy were compared and contrasted at length. This discussion was placed in the context of the patient's own specific clinical presentation and past medical history. All of her questions have been addressed.  Following the decision to proceed with transcatheter aortic valve replacement, a discussion was held regarding what types of management strategies would be attempted intraoperatively in the event of life-threatening complications, including whether or not the patient would be considered a candidate for the use of cardiopulmonary bypass and/or conversion to open sternotomy for attempted surgical intervention. The patient is aware of the fact that transient use of cardiopulmonary bypass may be necessary. I think she would be a candidate for emergent sternotomy to manage any intraoperative complications although her operative risk would obviously be high.  The patient has been advised of a variety of complications that might develop including but not limited to risks of death, stroke, paravalvular leak, aortic dissection or other major  vascular complications, aortic annulus rupture, device embolization, cardiac rupture or perforation, mitral  regurgitation, acute myocardial infarction, arrhythmia, heart block or bradycardia requiring permanent pacemaker placement, congestive heart failure, respiratory failure, renal failure, pneumonia, infection, other late complications related to structural valve deterioration or migration, or other complications that might ultimately cause a temporary or permanent loss of functional independence or other long term morbidity. The patient provides full informed consent for the procedure as described and all questions were answered.     DETAILS OF THE OPERATIVE PROCEDURE  PREPARATION:    The patient is brought to the operating room on the above mentioned date and appropriate monitoring was established by the anesthesia team. The patient is placed in the supine position on the operating table.  Intravenous antibiotics are administered. The patient is monitored closely throughout the procedure under conscious sedation.    Baseline transthoracic echocardiogram was performed. The patient's abdomen and both groins are prepared and draped in a sterile manner. A time out procedure is performed.   PERIPHERAL ACCESS:    Using the modified Seldinger technique, femoral arterial and venous access was obtained with placement of 6 Fr sheaths on the left side.  A pigtail diagnostic catheter was passed through the left arterial sheath under fluoroscopic guidance into the aortic root.  A temporary transvenous pacemaker catheter was passed through the left femoral venous sheath under fluoroscopic guidance into the right ventricle.  The pacemaker was tested to ensure stable lead placement and pacemaker capture. Aortic root angiography was performed in order to determine the optimal angiographic angle for valve deployment.   TRANSFEMORAL ACCESS:   Percutaneous transfemoral access and sheath placement was performed  using ultrasound guidance.  The right common femoral artery was cannulated using a micropuncture needle and appropriate location was verified using hand injection angiogram.  A pair of Abbott Perclose percutaneous closure devices were placed and a 6 French sheath replaced into the femoral artery.  The patient was heparinized systemically and ACT verified > 250 seconds.    A 14 Fr transfemoral E-sheath was introduced into the right common femoral artery after progressively dilating over an Amplatz superstiff wire. An AL-1 catheter was used to direct a straight-tip exchange length wire across the native aortic valve into the left ventricle. This was exchanged out for a pigtail catheter and position was confirmed in the LV apex. Simultaneous LV and Ao pressures were recorded.  The pigtail catheter was exchanged for an Amplatz Extra-stiff wire in the LV apex.  Echocardiography was utilized to confirm appropriate wire position and no sign of entanglement in the mitral subvalvular apparatus.   BALLOON AORTIC VALVULOPLASTY:   Not performed   TRANSCATHETER HEART VALVE DEPLOYMENT:   An Edwards Sapien 3 Ultra transcatheter heart valve (size 23 mm, model #[id number], serial #[id number]) was prepared and crimped per manufacturer's guidelines, and the proper orientation of the valve is confirmed on the [employer] delivery system. The valve was advanced through the introducer sheath using normal technique until in an appropriate position in the abdominal aorta beyond the sheath tip. The balloon was then retracted and using the fine-tuning wheel was centered on the valve. The valve was then advanced across the aortic arch using appropriate flexion of the catheter. The valve was carefully positioned across the aortic valve annulus. The Commander catheter was retracted using normal technique. Once final position of the valve has been confirmed by angiographic assessment, the valve is deployed while temporarily holding  ventilation and during rapid ventricular pacing to maintain systolic blood pressure < 50 mmHg and pulse pressure < 10 mmHg. The balloon  inflation is held for >3 seconds after reaching full deployment volume. Once the balloon has fully deflated the balloon is retracted into the ascending aorta and valve function is assessed using echocardiography. There is felt to be no paravalvular leak and no central aortic insufficiency.  The patient's hemodynamic recovery following valve deployment is good.  The deployment balloon and guidewire are both removed.    PROCEDURE COMPLETION:   The sheath was removed and femoral artery closure performed.  Protamine was administered once femoral arterial repair was complete. The temporary pacemaker, pigtail catheters and femoral sheaths were removed with manual pressure used for hemostasis.  A Mynx femoral closure device was utilized following removal of the diagnostic sheath in the left femoral artery.  The patient tolerated the procedure well and is transported to the cath lab recovery area in stable condition. There were no immediate intraoperative complications. All sponge instrument and needle counts are verified correct at completion of the operation.   No blood products were administered during the operation.  The patient received a total of 60 mL of intravenous contrast during the procedure.   Gaye Pollack, MD 11/06/2019

## 2019-11-06 NOTE — H&P (Signed)
Cardiology Admission History and Physical:   Patient ID: Hannah Briggs MRN: [id number]; DOB: September 27, 1931   Admission date: 11/06/2019  Primary Care Provider: Renaldo Reel, PA Primary Cardiologist: Bettina Gavia   Chief Complaint:  Dyspnea  History of Present Illness:   Hannah Briggs is a 86 yo female with HTN, HLD, CKD and severe AS. Two month history of progressive fatigue and dyspnea. Echo February 2021 with severe AS.    Past Medical History:  Diagnosis Date  . Arthritis   . CKD (chronic kidney disease) 03/08/2017  . GERD (gastroesophageal reflux disease) 03/08/2017  . Hemoptysis 04/04/2018  . Hypertension   . Hypertensive heart disease 03/08/2017  . Pre-diabetes   . Pure hypercholesterolemia 04/08/2015   Overview:  Intolerant of pravastatin and simvastatin  . Rectal cancer ([place])   . Severe aortic stenosis   . Vitamin D deficiency 03/08/2017    Past Surgical History:  Procedure Laterality Date  . APPENDECTOMY    . BLADDER SURGERY    . BREAST BIOPSY    . CESAREAN SECTION    . CHOLECYSTECTOMY    . COLON SURGERY    . EUS N/A 02/24/2017   Procedure: LOWER ENDOSCOPIC ULTRASOUND (EUS);  Surgeon: Milus Banister, MD;  Location: [facility];  Service: Endoscopy;  Laterality: N/A;  . EYE SURGERY    . RIGHT/LEFT HEART CATH AND CORONARY ANGIOGRAPHY N/A 10/15/2019   Procedure: RIGHT/LEFT HEART CATH AND CORONARY ANGIOGRAPHY;  Surgeon: Burnell Blanks, MD;  Location: [facility];  Service: Cardiovascular;  Laterality: N/A;  . TONSILLECTOMY       Medications Prior to Admission: Prior to Admission medications   Medication Sig Start Date End Date Taking? Authorizing Provider  aluminum hydroxide-magnesium carbonate (GAVISCON) 95-358 MG/15ML SUSP Take 30 mLs by mouth at bedtime. Hiatal hernia    [provider]  amLODipine (NORVASC) 10 MG tablet Take 10 mg by mouth daily.     [provider]  aspirin EC 81 MG tablet Take 81 mg by mouth daily.    [provider]  benazepril (LOTENSIN) 40 MG tablet Take 40 mg by mouth daily.    [provider]  Calcium Carb-Cholecalciferol (CALCIUM 600+D3 PO) Take 1 tablet by mouth daily.    [provider]  calcium carbonate (TUMS EX) 750 MG chewable tablet Chew 1 tablet by mouth 4 (four) times daily as needed for heartburn.     [provider]  cholecalciferol (VITAMIN D) 1000 units tablet Take 1,000 Units by mouth daily.    [provider]  Cholecalciferol (VITAMIN D-3) 125 MCG (5000 UT) TABS Take 5,000 Units by mouth daily.    [provider]  Coenzyme Q10 (COQ10) 100 MG CAPS Take 100 mg by mouth at bedtime.    [provider]  ezetimibe (ZETIA) 10 MG tablet Take 10 mg by mouth every evening.    [provider]  fenofibrate (TRICOR) 145 MG tablet Take 145 mg by mouth every Tuesday, Thursday, Saturday, and Sunday at 6 PM.  09/17/19   [provider]  ferrous gluconate (IRON 27) 240 (27 FE) MG tablet Take 27 mg by mouth daily.    [provider]  hydrochlorothiazide (MICROZIDE) 12.5 MG capsule Take 12.5 mg by mouth daily.    [provider]  montelukast (SINGULAIR) 10 MG tablet Take 10 mg by mouth daily as needed (sinus drainage).  10/17/18   [provider]  Multiple Vitamin (MULTIVITAMIN WITH MINERALS) TABS tablet Take 1 tablet by  mouth daily.    [provider]  nystatin cream (MYCOSTATIN) Apply 1 application topically 2 (two) times daily as needed (irritation).    [provider]  omeprazole (PRILOSEC) 20 MG capsule Take 20 mg by mouth daily before breakfast.  10/23/18   [provider]  OVER THE COUNTER MEDICATION Take 1,500-3,000 mg by mouth See admin instructions. (D-mannose) Take 1 capsule (1500 mg) by mouth in the morning & take 2 capsules (3000 mg) by mouth at night.    [provider]  Potassium 99 MG TABS Take 99 mg by mouth daily.    [provider]   pravastatin (PRAVACHOL) 20 MG tablet Take 40 mg by mouth every Monday, Wednesday, and Friday. At night     [provider]  Probiotic Product (PROBIOTIC PO) Take 1 capsule by mouth daily.    [provider]  tiZANidine (ZANAFLEX) 2 MG tablet Take 2 mg by mouth at bedtime as needed for muscle spasms.  09/25/19   [provider]  Turmeric 500 MG CAPS Take 500 mg by mouth daily.     [provider]  Wheat Dextrin (BENEFIBER DRINK [place]) PACK Take 1 Package by mouth every other day as needed (constipation.).     [provider]     Allergies:    Allergies  Allergen Reactions  . Claritin-D 12 Hour [Loratadine-Pseudoephedrine Er]     Pt was taking a 12 day course when she stopped course pt starting have rapid heart beat   . Codeine     "climb the wall"  . Lisinopril     Swelling of tongue   . Statins     Muscle and joint pain    Social History:   Social History   Socioeconomic History  . Marital status: Widowed    Spouse name: Not on file  . Number of children: Not on file  . Years of education: Not on file  . Highest education level: Not on file  Occupational History  . Not on file  Tobacco Use  . Smoking status: Never Smoker  . Smokeless tobacco: Never Used  Substance and Sexual Activity  . Alcohol use: No  . Drug use: No  . Sexual activity: Not on file  Other Topics Concern  . Not on file  Social History Narrative  . Not on file   Social Determinants of Health   Financial Resource Strain:   . Difficulty of Paying Living Expenses:   Food Insecurity:   . Worried About Charity fundraiser in the Last Year:   . Arboriculturist in the Last Year:   Transportation Needs:   . Film/video editor (Medical):   Marland Kitchen Lack of Transportation (Non-Medical):   Physical Activity:   . Days of Exercise per Week:   . Minutes of Exercise per Session:   Stress:   . Feeling of Stress :   Social Connections:   . Frequency of Communication  with Friends and Family:   . Frequency of Social Gatherings with Friends and Family:   . Attends Religious Services:   . Active Member of Clubs or Organizations:   . Attends Archivist Meetings:   Marland Kitchen Marital Status:   Intimate Partner Violence:   . Fear of Current or Ex-Partner:   . Emotionally Abused:   Marland Kitchen Physically Abused:   . Sexually Abused:     Family History:   The patient's family history includes AAA (abdominal aortic aneurysm) in her  brother; Congestive Heart Failure in her father; Heart attack in her mother; Heart disease in her sister; Parkinson's disease in her sister.    ROS:  Please see the history of present illness.  All other ROS reviewed and negative.     Physical Exam/Data:   Vitals:   11/06/19 0711  BP: (!) 156/60  Pulse: (!) 106  Resp: 17  Temp: 97.7 F (36.5 C)  SpO2: 97%  Weight: 69.9 kg  Height: 4\' 11"  (1.499 m)   No intake or output data in the 24 hours ending 11/06/19 0717 Last 3 Weights 11/06/2019 11/02/2019 10/24/2019  Weight (lbs) 154 lb 1.6 oz 154 lb 155 lb  Weight (kg) 69.9 kg 69.854 kg 70.308 kg     Body mass index is 31.12 kg/m.  General:  Well nourished, well developed, in no acute distress HEENT: normal Lymph: no adenopathy Neck: no JVD Endocrine:  No thryomegaly Vascular: No carotid bruits; FA pulses 2+ bilaterally without bruits  Cardiac:  normal S1, S2; RRR; systolic murmur  Lungs:  clear to auscultation bilaterally, no wheezing, rhonchi or rales  Abd: soft, nontender, no hepatomegaly  Ext: no edema Musculoskeletal:  No deformities, BUE and BLE strength normal and equal Skin: warm and dry  Neuro:  CNs 2-12 intact, no focal abnormalities noted Psych:  Normal affect   Relevant CV Studies:  Procedure: 2D Echo   Indications: Nonrheumatic aortic valve stenosis [I35.0] - Primary   History: Patient has no prior history of Echocardiogram  examinations.   Sonographer: Luane School  Referring Phys: [phone] [place]    1. Left ventricular ejection fraction, by estimation, is 65 to 70%. The  left ventricle has normal function. The left ventrical has no regional  wall motion abnormalities. There is moderately increased left ventricular  hypertrophy. Left ventricular  diastolic function could not be evaluated.  2. Left atrial size was moderately dilated.  3. MS visually appears to be at least moderate, however MVA by P1/2 is  only 2.72 cm2. Gradent accross MV significantly elevated. Consider TEE for  better assessment of MS. The mitral valve is normal in structure and  function. no evidence of mitral valve  regurgitation. Mild to moderate mitral stenosis.  4. Aortic valve regurgitation is not visualized. Severe aortic valve  stenosis.  Progression of the AS as compared to study from 02/21/2019. Peak/mean  gradient was 66/39 mmHg, DI was .29. Now Peak/mean gradient 79/48 mmHg,  AVA 08 cm2, DI 0.26   FINDINGS  Left Ventricle: Left ventricular ejection fraction, by estimation, is 65  to 70%. The left ventricle has normal function. The left ventricle has no  regional wall motion abnormalities. The left ventricular internal cavity  size was normal in size. There is  moderately increased left ventricular hypertrophy. Left ventricular  diastolic function could not be evaluated.   Right Ventricle: The right ventricular size is normal. No increase in  right ventricular wall thickness. Right ventricular systolic function is  normal. There is mildly elevated pulmonary artery systolic pressure. The  tricuspid regurgitant velocity is 2.65  m/s, and with an assumed right atrial pressure of 3 mmHg, the estimated  right ventricular systolic pressure is 24.5 mmHg.   Left Atrium: Left atrial size was moderately dilated.   Right Atrium: Right atrial size was normal in size.   Pericardium: There is no evidence of pericardial effusion.   Mitral Valve: MS visually appears to be at least moderate,  however MVA by  P1/2  is only 2.72 cm2. Gradent accross MV significantly elevated. Consider  TEE for better assessment of MS. The mitral valve is normal in structure  and function. There is moderate  calcification of the mitral valve leaflet(s). Moderately decreased  mobility of the mitral valve leaflets. Moderate mitral annular  calcification. No evidence of mitral valve regurgitation. Mild to moderate  mitral valve stenosis. MV peak gradient, 30.0  mmHg. The mean mitral valve gradient is 17.5 mmHg.   Tricuspid Valve: The tricuspid valve is normal in structure. Tricuspid  valve regurgitation is mild . No evidence of tricuspid stenosis.   Aortic Valve: Progression of the AS as compared to study from 02/21/2019.  Peak/mean gradient was 66/39 mmHg, DI was .29. Now Peak/mean gradient  79/48 mmHg, AVA 08 cm2, DI 0.26.  The aortic valve is normal in structure and function. Aortic valve  regurgitation is not visualized. Severe aortic stenosis is present. Aortic  valve mean gradient measures 48.0 mmHg. Aortic valve peak gradient  measures 78.9 mmHg. Aortic valve area, by VTI  measures 0.82 cm.   Pulmonic Valve: The pulmonic valve was normal in structure. Pulmonic valve  regurgitation is not visualized. No evidence of pulmonic stenosis.   Aorta: The aortic root is normal in size and structure.   Venous: The inferior vena cava is normal in size with greater than 50%  respiratory variability, suggesting right atrial pressure of 3 mmHg. The  inferior vena cava and the hepatic vein show a normal flow pattern.   IAS/Shunts: No atrial level shunt detected by color flow Doppler.    LEFT VENTRICLE  PLAX 2D  LVIDd: 3.70 cm Diastology  LVIDs: 2.60 cm LV e' lateral: 4.24 cm/s  LV PW: 1.60 cm LV E/e' lateral: 58.3  LV IVS: 1.70 cm LV e' medial: 4.13 cm/s  LVOT diam: 2.00 cm LV E/e' medial: 59.8  LV SV: 71.63 ml  LV SV Index: 19.35  LVOT Area: 3.14 cm    RIGHT VENTRICLE IVC  RV S prime:  18.20 cm/s IVC diam: 1.40 cm  TAPSE (M-mode): 2.5 cm   LEFT ATRIUM Index RIGHT ATRIUM Index  LA diam: 4.40 cm 2.67 cm/m RA Area: 13.30 cm  LA Vol (A2C): 111.0 ml 67.33 ml/m RA Volume: 28.30 ml 17.17 ml/m  LA Vol (A4C): 56.1 ml 34.03 ml/m  LA Biplane Vol: 81.0 ml 49.13 ml/m  AORTIC VALVE  AV Area (Vmax): 0.82 cm  AV Area (Vmean): 0.79 cm  AV Area (VTI): 0.82 cm  AV Vmax: 444.00 cm/s  AV Vmean: 325.000 cm/s  AV VTI: 0.878 m  AV Peak Grad: 78.9 mmHg  AV Mean Grad: 48.0 mmHg  LVOT Vmax: 116.00 cm/s  LVOT Vmean: 81.800 cm/s  LVOT VTI: 0.228 m  LVOT/AV VTI ratio: 0.26   AORTA  Ao Root diam: 2.60 cm  Ao Asc diam: 3.10 cm   MITRAL VALVE TRICUSPID VALVE  MV Area (PHT): 2.73 cm TR Peak grad: 28.1 mmHg  MV Peak grad: 30.0 mmHg TR Vmax: 265.00 cm/s  MV Mean grad: 17.5 mmHg  MV Vmax: 2.74 m/s SHUNTS  MV Vmean: 194.0 cm/s Systemic VTI: 0.23 m  MV Decel Time: 278 msec Systemic Diam: 2.00 cm  MV E velocity: 247.00 cm/s 103 cm/s  MV A velocity: 213.00 cm/s 70.3 cm/s  MV E/A ratio: 1.16 1.5   Jenne Campus MD  Electronically signed by Jenne Campus MD  Signature Date/Time: 08/30/2019/7:05:32 PM  Physicians  Panel Physicians Referring Physician Case Authorizing Physician  Burnell Blanks, MD (Primary)  Procedures  RIGHT/LEFT HEART CATH AND CORONARY ANGIOGRAPHY     Conclusion  Prox RCA lesion is 10% stenosed.  Dist RCA lesion is 10% stenosed.  Ost LM to Mid LM lesion is 10% stenosed.  Prox Cx lesion is 10% stenosed.  Mid LAD lesion is 20% stenosed. 1. Mild non-obstructive CAD  2. Elevated wedge pressure  3. Severe aortic stenosis by echo (cath hemodynamics mean gradient 25.6 mmHg, peak to peak gradient 36 mmHg, AVA 0.89cm2).  Recommendations: Will continue workup for TAVR. We will contact her to arrange her CT scans, carotid dopplers, PT assessment and appointment with the CT surgeon. I will have her increase her HCTZ to 25 mg daily. If no resolution  of dyspnea and mild LE edema over the next few days, will need to stop HCTZ and start Lasix.      Recommendations  Antiplatelet/Anticoag Continue planning for TAVR.        Indications  Severe aortic stenosis [I35.0 (ICD-10-CM)]     Procedural Details  Technical Details Indication: 86 yo female with history of severe AS, planning for TAVR  Procedure: The risks, benefits, complications, treatment options, and expected outcomes were discussed with the patient. The patient and/or family concurred with the proposed plan, giving informed consent. The patient was brought to the cath lab after IV hydration was given. The patient was sedated with Versed and Fentanyl. The IV catheter that was present in the right antecubital vein was changed for a 5 [place] sheath. Right heart catheterization performed with a balloon tipped catheter. The right wrist was prepped and draped in a sterile fashion. 1% lidocaine was used for local anesthesia. Using the modified Seldinger access technique, a 5 French sheath was placed in the right radial artery. 3 mg Verapamil was given through the sheath. 4000 units IV heparin was given. Standard diagnostic catheters were used to perform selective coronary angiography. I crossed the aortic valve with an AL-1 and a J wire. LV pressures measured. The sheath was removed from the right radial artery and a Terumo hemostasis band was applied at the arteriotomy site on the right wrist.    Estimated blood loss <50 mL.   During this procedure medications were administered to achieve and maintain moderate conscious sedation while the patient's heart rate, blood pressure, and oxygen saturation were continuously monitored and I was present face-to-face 100% of this time.     Medications  (Filter: Administrations occurring from 10/15/19 0835 to 10/15/19 0934)  Heparin (Porcine) in NaCl 1000-0.9 UT/500ML-% SOLN (mL)  Total volume: 500 mL  Date/Time   Rate/Dose/Volume Action  10/15/19  0847  500 mL Given  Heparin (Porcine) in NaCl 1000-0.9 UT/500ML-% SOLN (mL)  Total volume: 500 mL  Date/Time   Rate/Dose/Volume Action  10/15/19 0847  500 mL Given  fentaNYL (SUBLIMAZE) injection (mcg)  Total dose: 50 mcg  Date/Time   Rate/Dose/Volume Action  10/15/19 0857  25 mcg Given  0905  25 mcg Given  midazolam (VERSED) injection (mg)  Total dose: 2 mg  Date/Time   Rate/Dose/Volume Action  10/15/19 0857  1 mg Given  0905  1 mg Given  lidocaine (PF) (XYLOCAINE) 1 % injection (mL)  Total volume: 6 mL  Date/Time   Rate/Dose/Volume Action  10/15/19 0905  3 mL Given  0905  3 mL Given  Radial Cocktail/Verapamil only (mL)  Total volume: 10 mL  Date/Time   Rate/Dose/Volume Action  10/15/19 0907  10 mL Given  heparin injection (Units)  Total dose: 4,000 Units  Date/Time   Rate/Dose/Volume Action  10/15/19 0915  4,000 Units Given  iohexol (OMNIPAQUE) 350 MG/ML injection (mL)  Total volume: 35 mL  Date/Time   Rate/Dose/Volume Action  10/15/19 0929  35 mL Given     Sedation Time  Sedation Time Physician-1: 28 minutes 48 seconds        Contrast  Medication Name Total Dose  iohexol (OMNIPAQUE) 350 MG/ML injection 35 mL     Radiation/Fluoro  Fluoro time: 4 (min)  DAP: 54270 (mGycm2)  Cumulative Air Kerma: 623 (mGy)     Complications     Complications documented before study signed (10/15/2019 9:42 AM)  RIGHT/LEFT HEART CATH AND CORONARY ANGIOGRAPHY  None Documented by Burnell Blanks, MD 10/15/2019 9:40 AM  Date Found: 10/15/2019  Time Range: Intraprocedure       Coronary Findings  Diagnostic  Dominance: Right  Left Main  Ost LM to Mid LM lesion 10% stenosed  Ost LM to Mid LM lesion is 10% stenosed. The lesion is calcified.   Left Anterior Descending  Mid LAD lesion 20% stenosed  Mid LAD lesion is 20% stenosed.   Left Circumflex  Prox Cx lesion 10% stenosed  Prox Cx lesion is 10% stenosed.   Right Coronary Artery  Vessel is large.  Prox  RCA lesion 10% stenosed  Prox RCA lesion is 10% stenosed.  Dist RCA lesion 10% stenosed  Dist RCA lesion is 10% stenosed.  Intervention  No interventions have been documented.                            Coronary Diagrams  Diagnostic  Dominance: Right  &&&&&  Intervention       Implants  No implant documentation for this case.      [employer]ng[employer] Images Link to Procedure Log  Show images for CARDIAC CATHETERIZATION Procedure Log     Images on Long Term Storage   Show images for [place], [place]            [facility] Data    Most Recent Value  Fick Cardiac Output 5.11 L/min  Fick Cardiac Output Index 3.15 (L/min)/BSA  Aortic Mean Gradient 25.62 mmHg  Aortic Peak Gradient 36 mmHg  Aortic Valve Area 0.89  Aortic Value Area Index 0.55 cm2/BSA  RA A Wave 7 mmHg  RA V Wave 6 mmHg  RA Mean 5 mmHg  RV Systolic Pressure 50 mmHg  RV Diastolic Pressure 1 mmHg  RV EDP 6 mmHg  PA Systolic Pressure 47 mmHg  PA Diastolic Pressure 10 mmHg  PA Mean 31 mmHg  PW A Wave 28 mmHg  PW V Wave 44 mmHg  PW Mean 30 mmHg  AO Systolic Pressure 762 mmHg  AO Diastolic Pressure 54 mmHg  AO Mean 78 mmHg  LV Systolic Pressure 831 mmHg  LV Diastolic Pressure 12 mmHg  LV EDP 16 mmHg  AOp Systolic Pressure 517 mmHg  AOp Diastolic Pressure 57 mmHg  AOp Mean Pressure 82 mmHg  LVp Systolic Pressure 616 mmHg  LVp Diastolic Pressure 10 mmHg  LVp EDP Pressure 15 mmHg  QP/QS 1  TPVR Index 9.82 HRUI  TSVR Index 24.74 HRUI  PVR SVR Ratio 0.11  TPVR/TSVR Ratio 0.4   ADDENDUM REPORT: 10/18/2019 21:05  CLINICAL DATA: Severe Aortic Stenosis.  EXAM:  Cardiac TAVR CT  TECHNIQUE:  The patient was scanned on a [employer]. A 120 kV  retrospective scan was triggered in the descending thoracic aorta  at  111 HU's. Gantry rotation speed was 250 msecs and collimation was .6  mm. No beta blockade or nitro were given. The 3D data set was  reconstructed in 5% intervals of the R-R cycle.  Systolic and  diastolic phases were analyzed on a dedicated work station using  MPR, MIP and VRT modes. The patient received 80 cc of contrast.  FINDINGS:  Image quality: Excellent.  Noise artifact is: Limited.  Valve Morphology: The aortic valve is tricuspid and severely  calcified/thickened with restricted movement in systole. There is no  protruding calcium.  Aortic Valve Area: 1.02 cm2  Aortic Valve Calcium score: 937 AU  Aortic annular dimension:  Phase assessed: 30%  Annular area: 364 mm2  Annular perimeter: 69.1 mm  Max diameter: 23.6 mm  Min diameter: 19.8 mm  Annular and subannular calcification: No significant annular or  sub-annular calcifications.  Optimal coplanar projection: LAO 13 CAU 8  Coronary Artery Height above Annulus:  Left Main: 10.1 mm  Right Coronary: 16.2 mm  Sinus of Valsalva Measurements:  Non-coronary: 27 mm  Right-coronary: 26 mm  Left-coronary: 27 mm  Sinus of Valsalva Height:  Non-coronary: 22 mm  Right-coronary: 18.1 mm  Left-coronary: 18.5 mm  Sinotubular Junction: 24 mm  Ascending Thoracic Aorta: 29 mm  Coronary Arteries: Normal coronary origin. Right dominance. The  study was performed without use of NTG and is insufficient for  plaque evaluation.  Cardiac Morphology:  Right Atrium: Right atrial size is dilated.  Right Ventricle: The right ventricular cavity is within normal  limits.  Left Atrium: Left atrial size is dilated with no left atrial  appendage filling defect.  Left Ventricle: The ventricular cavity size is within normal limits.  There are no stigmata of prior infarction. There is no abnormal  filling defect. LVEF=77%, no regional wall motion abnormalities.  Pulmonary arteries: Normal in size without proximal filling defect.  Pulmonary veins: Normal pulmonary venous drainage.  Pericardium: Normal thickness with no significant effusion or  calcium present.  Mitral Valve: The mitral valve is normal structure with severe   mitral annular calcification.  Extra-cardiac findings: See attached radiology report for  non-cardiac structures.  IMPRESSION:  1. Severely thickened and calcified aortic valve consistent with  severe aortic stenosis.  2. Annular measurements appropriate for 23 mm Edwards Sapien 3  valve.  3. Borderline left main coronary height to annulus (10.1 mm).  4. Optimal Fluoroscopic Angle for Delivery: LAO 13 CAU 8.  5. Severe mitral annular calcification.  <CurrentUser>, MD  Electronically Signed  By: Eleonore Chiquito  On: 10/18/2019 21:05   Addended by Geralynn Rile, MD on 10/18/2019 9:07 PM  Study Result   EXAM:  OVER-READ INTERPRETATION CT CHEST  The following report is an over-read performed by radiologist Dr.  Vinnie Langton of [facility], [place] on 10/18/2019. This  over-read does not include interpretation of cardiac or coronary  anatomy or pathology. The coronary calcium score/coronary CTA  interpretation by the cardiologist is attached.  COMPARISON: Chest CT 04/04/2018.  FINDINGS:  Extracardiac findings will be described separately under dictation  for contemporaneously obtained CTA chest, abdomen and pelvis.  IMPRESSION:  Please see separate dictation for contemporaneously obtained CTA  chest, abdomen and pelvis dated 10/18/2019 for full description of  relevant extracardiac findings.  Electronically Signed:  By: Vinnie Langton M.D.  On: 10/18/2019 12:07  CLINICAL DATA: 86 year old female under preprocedural evaluation  prior to potential transcatheter aortic valve replacement (TAVR)  procedure.  EXAM:  CT ANGIOGRAPHY CHEST,  ABDOMEN AND PELVIS  TECHNIQUE:  Multidetector CT imaging through the chest, abdomen and pelvis was  performed using the standard protocol during bolus administration of  intravenous contrast. Multiplanar reconstructed images and MIPs were  obtained and reviewed to evaluate the vascular anatomy.  CONTRAST: 132mL OMNIPAQUE IOHEXOL 350  MG/ML SOLN  COMPARISON: Chest CT 04/04/2018.  FINDINGS:  CTA CHEST FINDINGS  Cardiovascular: Heart size is mildly enlarged. There is no  significant pericardial fluid, thickening or pericardial  calcification. There is aortic atherosclerosis, as well as  atherosclerosis of the great vessels of the mediastinum and the  coronary arteries, including calcified atherosclerotic plaque in the  left main, left anterior descending and right coronary arteries.  Thickening calcification of the aortic valve. Severe calcifications  of the mitral annulus and mitral valve.  Mediastinum/Lymph Nodes: No pathologically enlarged mediastinal or  hilar lymph nodes. Moderate hiatal hernia. No axillary  lymphadenopathy.  Lungs/Pleura: Small pulmonary nodules are noted in the lungs,  largest of which is in the left upper lobe (axial image 28 of series  8), measuring 6 x 6 mm. In addition, there is a ground-glass  attenuation 1.6 x 1.0 cm lesion in the left upper lobe (axial image  34 of series 8). Patchy areas of ground-glass attenuation and  interlobular septal thickening noted throughout the lungs  bilaterally. No acute consolidative airspace disease. No pleural  effusions.  Musculoskeletal/Soft Tissues: There are no aggressive appearing  lytic or blastic lesions noted in the visualized portions of the  skeleton.  CTA ABDOMEN AND PELVIS FINDINGS  Hepatobiliary: No suspicious cystic or solid hepatic lesions. No  intra or extrahepatic biliary ductal dilatation. Status post  cholecystectomy.  Pancreas: In the head of the pancreas (axial image 114 of series 7  and coronal image 66 of series 9) there is a 1.3 x 0.9 x 1.2 cm  low-attenuation lesion in close proximity to the common bile duct.  No other pancreatic mass. No pancreatic ductal dilatation. No  peripancreatic fluid collections or inflammatory changes.  Spleen: Unremarkable.  Adrenals/Urinary Tract: Bilateral kidneys and bilateral adrenal  glands  are normal in appearance. No hydroureteronephrosis. Small  amount of gas non dependently in the urinary bladder. Otherwise,  urinary bladder is normal in appearance.  Stomach/Bowel: Normal appearance of the stomach. No pathologic  dilatation of small bowel or colon. Numerous colonic diverticulae  are noted, particularly in the sigmoid colon, without surrounding  inflammatory changes to suggest an acute diverticulitis at this  time. The appendix is not confidently identified and may be  surgically absent. Regardless, there are no inflammatory changes  noted adjacent to the cecum to suggest the presence of an acute  appendicitis at this time.  Vascular/Lymphatic: Aortic atherosclerosis, with vascular findings  and measurements pertinent to potential TAVR procedure, as detailed  below. No aneurysm or dissection noted in the abdominal or pelvic  vasculature. No lymphadenopathy noted in the abdomen or pelvis.  Reproductive: Status post hysterectomy. Ovaries are not confidently  identified may be surgically absent or trophic.  Other: No significant volume of ascites. No pneumoperitoneum.  Musculoskeletal: There are no aggressive appearing lytic or blastic  lesions noted in the visualized portions of the skeleton.  VASCULAR MEASUREMENTS PERTINENT TO TAVR:  AORTA:  Minimal Aortic Diameter-13 x 10 mm  Severity of Aortic Calcification-moderate to severe  RIGHT PELVIS:  Right Common Iliac Artery -  Minimal Diameter-8.3 x 7.9 mm  Tortuosity-mild  Calcification-mild  Right External Iliac Artery -  Minimal Diameter-5.9 x 6.6 mm  Tortuosity-mild-to-moderate  Calcification-none  Right Common Femoral Artery -  Minimal Diameter-6.6 x 6.7 mm  Tortuosity-mild  Calcification-mild  LEFT PELVIS:  Left Common Iliac Artery -  Minimal Diameter-9.2 x 8.6 mm  Tortuosity-mild  Calcification-mild  Left External Iliac Artery -  Minimal Diameter-6.1 x 6.1 mm  Tortuosity-mild-to-moderate   Calcification-none  Left Common Femoral Artery -  Minimal Diameter-6.7 x 6.9 mm  Tortuosity-mild  Calcification-mild  Review of the MIP images confirms the above findings.  IMPRESSION:  1. Vascular findings and measurements pertinent to potential TAVR  procedure, as detailed above.  2. Thickening calcification of the aortic valve, compatible with the  reported clinical history of severe aortic stenosis.  3. Severe calcifications also noted of the mitral annulus and mitral  valve.  4. Aortic atherosclerosis, in addition to left main and 2 vessel  coronary artery disease.  5. Small pulmonary nodules in the lungs, largest of which is a  ground-glass attenuation nodule in the left upper lobe (1.6 x 1.0  cm). Initial follow-up with CT at 6-12 months is recommended to  confirm persistence. If persistent, repeat CT is recommended every 2  years until 5 years of stability has been established. This  recommendation follows the consensus statement: Guidelines for  Management of Incidental Pulmonary Nodules Detected on CT Images:  From the Fleischner Society 2017; Radiology 2017; 284:228-243.  6. The appearance of the lungs suggests mild interstitial pulmonary  edema. Mild cardiomegaly. Imaging findings suggestive of mild  congestive heart failure.  7. 1.3 x 0.9 x 1.2 cm low-attenuation lesion in the head of the  pancreas. This is nonspecific, and may simply represent a tiny  pancreatic pseudocyst. However, attention on follow-up studies is  recommended. Follow-up abdominal MRI with and without IV gadolinium  with MRCP or pancreatic protocol CT scan is recommended in 2 months  to ensure the stability of this finding. This recommendation follows  ACR consensus guidelines: Management of Incidental Pancreatic Cysts:  A White Paper of the ACR Incidental Findings Committee. J Am Coll  Radiol 2993;71:696-789.  8. Severe colonic diverticulosis without evidence of acute  diverticulitis at this  time.  9. Additional incidental findings, as above.  Electronically Signed  By: Vinnie Langton M.D.  On: 10/18/2019 14:23  Procedure: Isolated AVR   Risk of Mortality: 3.536%  Renal Failure: 3.178%  Permanent Stroke: 1.607%  Prolonged Ventilation: 9.218%  DSW Infection: 0.132%  Reoperation: 2.680%  Morbidity or Mortality: 13.620%  Short Length of Stay: 25.372%  Long Length of Stay: 7.820%    Laboratory Data:  High Sensitivity Troponin:  No results for input(s): TROPONINIHS in the last 720 hours.    Chemistry Recent Labs  Lab 11/02/19 0927  NA 136  K 3.8  CL 100  CO2 25  GLUCOSE 117*  BUN 18  CREATININE 1.04*  CALCIUM 9.8  GFRNONAA 49*  GFRAA 57*  ANIONGAP 11    Recent Labs  Lab 11/02/19 0927  PROT 6.6  ALBUMIN 4.2  AST 20  ALT 19  ALKPHOS 35*  BILITOT 0.5   Hematology Recent Labs  Lab 11/02/19 0927  WBC 6.7  RBC 4.27  HGB 11.6*  HCT 36.6  MCV 85.7  MCH 27.2  MCHC 31.7  RDW 13.3  PLT 243   BNP Recent Labs  Lab 11/02/19 0927  BNP 297.2*    DDimer No results for input(s): DDIMER in the last 168 hours.   Radiology/Studies:  No results found.  Assessment and Plan:   Severe AS: She has  severe, stage D aortic valve stenosis. I have personally reviewed the echo images. The aortic valve is thickened, calcified with limited leaflet mobility.will proceed with TAVR today. I have reviewed the natural history of aortic stenosis with the patient and their family members  who are present today. We have discussed the limitations of medical therapy and the poor prognosis associated with symptomatic aortic stenosis. We have reviewed potential treatment options, including palliative medical therapy, conventional surgical aortic valve replacement, and transcatheter aortic valve replacement. We discussed treatment options in the context of the patient's specific comorbid medical conditions.     For questions or updates, please contact [place] Please  consult [email or web address] for contact info under       Signed, Lauree Chandler, MD  11/06/2019 7:17 AM

## 2019-11-06 NOTE — Progress Notes (Signed)
Echocardiogram 2D Echocardiogram has been performed.  Oneal Deputy Valen Mascaro 11/06/2019, 10:44 AM

## 2019-11-06 NOTE — Anesthesia Procedure Notes (Signed)
Procedure Name: MAC Date/Time: 11/06/2019 9:13 AM Performed by: Mariea Clonts, CRNA Pre-anesthesia Checklist: Patient identified, Emergency Drugs available, Suction available, Timeout performed and Patient being monitored Patient Re-evaluated:Patient Re-evaluated prior to induction Oxygen Delivery Method: Simple face mask and Nasal cannula

## 2019-11-06 NOTE — Anesthesia Procedure Notes (Signed)
Arterial Line Insertion Start/End4/20/2021 8:00 AM, 11/06/2019 8:05 AM Performed by: CRNA  Patient location: Pre-op. Preanesthetic checklist: patient identified, IV checked, site marked, risks and benefits discussed, surgical consent, monitors and equipment checked, pre-op evaluation, timeout performed and anesthesia consent Lidocaine 1% used for infiltration Right, radial was placed Catheter size: 20 G Hand hygiene performed  and maximum sterile barriers used   Attempts: 1 Procedure performed without using ultrasound guided technique. Following insertion, Biopatch and dressing applied. Post procedure assessment: normal  Patient tolerated the procedure well with no immediate complications.

## 2019-11-06 NOTE — CV Procedure (Signed)
[facility]  TAVR OPERATIVE NOTE   Date of Procedure:  11/06/2019  Preoperative Diagnosis: Severe Aortic Stenosis   Postoperative Diagnosis: Same   Procedure:    Transcatheter Aortic Valve Replacement - Transfemoral Approach  Edwards Sapien 3 THV (size 23 mm, model #[id number] , serial # [id number])   Co-Surgeons:  Lauree Chandler, MD and Gaye Pollack, MD   Anesthesiologist:  Nyoka Cowden  Echocardiographer:  Johnsie Cancel  Pre-operative Echo Findings:  Severe aortic stenosis  Normal left ventricular systolic function  Post-operative Echo Findings:  No  paravalvular leak  Normal left ventricular systolic function  BRIEF CLINICAL NOTE AND INDICATIONS FOR SURGERY  Hannah Briggs is a 86 yo female with HTN, HLD, CKD and severe AS. Two month history of progressive fatigue and dyspnea. Echo February 2021 with severe AS.   During the course of the patient's preoperative work up they have been evaluated comprehensively by a multidisciplinary team of specialists coordinated through the [facility] in the [facility].  They have been demonstrated to suffer from symptomatic severe aortic stenosis as noted above. The patient has been counseled extensively as to the relative risks and benefits of all options for the treatment of severe aortic stenosis including long term medical therapy, conventional surgery for aortic valve replacement, and transcatheter aortic valve replacement.  The patient has been independently evaluated by Dr. Cyndia Bent with CT surgery and they are felt to be at high risk for conventional surgical aortic valve replacement. The surgeon indicated the patient would be a poor candidate for conventional surgery. Based upon review of all of the patient's preoperative diagnostic tests they are felt to be candidate for transcatheter aortic valve replacement using the transfemoral approach as an alternative to high risk  conventional surgery.    Following the decision to proceed with transcatheter aortic valve replacement, a discussion has been held regarding what types of management strategies would be attempted intraoperatively in the event of life-threatening complications, including whether or not the patient would be considered a candidate for the use of cardiopulmonary bypass and/or conversion to open sternotomy for attempted surgical intervention.  The patient has been advised of a variety of complications that might develop peculiar to this approach including but not limited to risks of death, stroke, paravalvular leak, aortic dissection or other major vascular complications, aortic annulus rupture, device embolization, cardiac rupture or perforation, acute myocardial infarction, arrhythmia, heart block or bradycardia requiring permanent pacemaker placement, congestive heart failure, respiratory failure, renal failure, pneumonia, infection, other late complications related to structural valve deterioration or migration, or other complications that might ultimately cause a temporary or permanent loss of functional independence or other long term morbidity.  The patient provides full informed consent for the procedure as described and all questions were answered preoperatively.    DETAILS OF THE OPERATIVE PROCEDURE  PREPARATION:   The patient is brought to the operating room on the above mentioned date and central monitoring was established by the anesthesia team including placement of a radial arterial line. The patient is placed in the supine position on the operating table.  Intravenous antibiotics are administered. Conscious sedation is used.   Baseline transthoracic echocardiogram was performed. The patient's chest, abdomen, both groins, and both lower extremities are prepared and draped in a sterile manner. A time out procedure is performed.   PERIPHERAL ACCESS:   Using the modified Seldinger technique,  femoral arterial and venous access were obtained with placement of 6 Fr sheaths on  the left side using u/s guidance.  A pigtail diagnostic catheter was passed through the femoral arterial sheath under fluoroscopic guidance into the aortic root.  A temporary transvenous pacemaker catheter was passed through the femoral venous sheath under fluoroscopic guidance into the right ventricle.  The pacemaker was tested to ensure stable lead placement and pacemaker capture. Aortic root angiography was performed in order to determine the optimal angiographic angle for valve deployment.  TRANSFEMORAL ACCESS:  A micropuncture kit was used to gain access to the right femoral artery. Position confirmed with angiography. Pre-closure with double ProGlide closure devices. The patient was heparinized systemically and ACT verified > 250 seconds.    A 14 Fr transfemoral E-sheath was introduced into the right femoral artery after progressively dilating over an Amplatz superstiff wire. An AL-1catheter was used to direct a straight-tip exchange length wire across the native aortic valve into the left ventricle. This was exchanged out for a pigtail catheter and position was confirmed in the LV apex. Simultaneous LV and Ao pressures were recorded.  The pigtail catheter was then exchanged for an Amplatz Extra-stiff wire in the LV apex.   TRANSCATHETER HEART VALVE DEPLOYMENT:  An Edwards Sapien 3 THV (size 23 mm) was prepared and crimped per manufacturer's guidelines, and the proper orientation of the valve is confirmed on the [employer] delivery system. The valve was advanced through the introducer sheath using normal technique until in an appropriate position in the abdominal aorta beyond the sheath tip. The balloon was then retracted and using the fine-tuning wheel was centered on the valve. The valve was then advanced across the aortic arch using appropriate flexion of the catheter. The valve was carefully positioned across  the aortic valve annulus. The Commander catheter was retracted using normal technique. Once final position of the valve has been confirmed by angiographic assessment, the valve is deployed while temporarily holding ventilation and during rapid ventricular pacing to maintain systolic blood pressure < 50 mmHg and pulse pressure < 10 mmHg. The balloon inflation is held for >3 seconds after reaching full deployment volume. Once the balloon has fully deflated the balloon is retracted into the ascending aorta and valve function is assessed using TTE. There is felt to be no paravalvular leak and no central aortic insufficiency.  The patient's hemodynamic recovery following valve deployment is good.  The deployment balloon and guidewire are both removed. Echo demostrated acceptable post-procedural gradients, stable mitral valve function, and no AI.   PROCEDURE COMPLETION:  The sheath was then removed and closure devices were completed. Protamine was administered once femoral arterial repair was complete. The temporary pacemaker, pigtail catheters and femoral sheaths were removed with a Mynx closure device placed in the left femoral artery and manual pressure used for venous hemostasis.   The patient tolerated the procedure well and is transported to the surgical intensive care in stable condition. There were no immediate intraoperative complications. All sponge instrument and needle counts are verified correct at completion of the operation.   No blood products were administered during the operation.  The patient received a total of 60 mL of intravenous contrast during the procedure.  Lauree Chandler MD 11/06/2019 11:02 AM

## 2019-11-06 NOTE — Anesthesia Postprocedure Evaluation (Signed)
Anesthesia Post Note  Patient: Hannah Briggs  Procedure(s) Performed: TRANSCATHETER AORTIC VALVE REPLACEMENT, TRANSFEMORAL (N/A ) TRANSESOPHAGEAL ECHOCARDIOGRAM (TEE) (N/A )     Patient location during evaluation: PACU Anesthesia Type: MAC Level of consciousness: awake Pain management: pain level controlled Vital Signs Assessment: post-procedure vital signs reviewed and stable Respiratory status: spontaneous breathing Cardiovascular status: stable Postop Assessment: no apparent nausea or vomiting Anesthetic complications: no    Last Vitals:  Vitals:   11/06/19 1147 11/06/19 1314  BP: 115/90 (!) 98/57  Pulse: 83 72  Resp: 18 20  Temp: (!) 36.3 C 36.5 C  SpO2:  94%    Last Pain:  Vitals:   11/06/19 1314  TempSrc: Oral  PainSc: 0-No pain                 Nakeyia Menden

## 2019-11-06 NOTE — Discharge Instructions (Signed)
ACTIVITY AND EXERCISE °• Daily activity and exercise are an important part of your recovery. People recover at different rates depending on their general health and type of valve procedure. °• Most people recovering from TAVR feel better relatively quickly  °• No lifting, pushing, pulling more than 10 pounds (examples to avoid: groceries, vacuuming, gardening, golfing): °            - For one week with a procedure through the groin. °            - For six weeks for procedures through the chest wall or neck. °NOTE: You will typically see one of our providers 7-14 days after your procedure to discuss WHEN TO RESUME the above activities.  °  °  °DRIVING °• Do not drive until you are seen for follow up and cleared by a provider. Generally, we ask patient to not drive for 1 week after their procedure. °• If you have been told by your doctor in the past that you may not drive, you must talk with him/her before you begin driving again. °  °DRESSING °• Groin site: you may leave the clear dressing over the site for up to one week or until it falls off. °  °HYGIENE °• If you had a femoral (leg) procedure, you may take a shower when you return home. After the shower, pat the site dry. Do NOT use powder, oils or lotions in your groin area until the site has completely healed. °• If you had a chest procedure, you may shower when you return home unless specifically instructed not to by your discharging practitioner. °            - DO NOT scrub incision; pat dry with a towel. °            - DO NOT apply any lotions, oils, powders to the incision. °            - No tub baths / swimming for at least 2 weeks. °• If you notice any fevers, chills, increased pain, swelling, bleeding or pus, please contact your doctor. °  °ADDITIONAL INFORMATION °• If you are going to have an upcoming dental procedure, please contact our office as you will require antibiotics ahead of time to prevent infection on your heart valve.  ° ° °If you have any  questions or concerns you can call the structural heart phone during normal business hours 8am-4pm. If you have an urgent need after hours or weekends please call [phone] to talk to the on call provider for general cardiology. If you have an emergency that requires immediate attention, please call 911.  ° ° °After TAVR Checklist ° °Check  Test Description  ° Follow up appointment in 1-2 weeks  You will see our structural heart physician assistant, Katie Johara Lodwick. Your incision sites will be checked and you will be cleared to drive and resume all normal activities if you are doing well.    ° 1 month echo and follow up  You will have an echo to check on your new heart valve and be seen back in the office by Katie Concettina Leth. Many times the echo is not read by your appointment time, but Katie will call you later that day or the following day to report your results.  ° Follow up with your primary cardiologist You will need to be seen by your primary cardiologist in the following 3-6 months after your 1 month appointment in the valve   clinic. Often times your Plavix or Aspirin will be discontinued during this time, but this is decided on a case by case basis.   ° 1 year echo and follow up You will have another echo to check on your heart valve after 1 year and be seen back in the office by Katie Gerldine Suleiman. This your last structural heart visit.  ° Bacterial endocarditis prophylaxis  You will have to take antibiotics for the rest of your life before all dental procedures (even teeth cleanings) to protect your heart valve. Antibiotics are also required before some surgeries. Please check with your cardiologist before scheduling any surgeries. Also, please make sure to tell us if you have a penicillin allergy as you will require an alternative antibiotic.   ° ° °

## 2019-11-07 ENCOUNTER — Inpatient Hospital Stay (HOSPITAL_COMMUNITY): Payer: Medicare Other

## 2019-11-07 DIAGNOSIS — Z954 Presence of other heart-valve replacement: Secondary | ICD-10-CM

## 2019-11-07 DIAGNOSIS — I350 Nonrheumatic aortic (valve) stenosis: Secondary | ICD-10-CM

## 2019-11-07 DIAGNOSIS — Z952 Presence of prosthetic heart valve: Secondary | ICD-10-CM

## 2019-11-07 LAB — BASIC METABOLIC PANEL
Anion gap: 8 (ref 5–15)
BUN: 19 mg/dL (ref 8–23)
CO2: 24 mmol/L (ref 22–32)
Calcium: 8.9 mg/dL (ref 8.9–10.3)
Chloride: 104 mmol/L (ref 98–111)
Creatinine, Ser: 1.06 mg/dL — ABNORMAL HIGH (ref 0.44–1.00)
GFR calc Af Amer: 55 mL/min — ABNORMAL LOW (ref >60)
GFR calc non Af Amer: 48 mL/min — ABNORMAL LOW (ref >60)
Glucose, Bld: 111 mg/dL — ABNORMAL HIGH (ref 70–99)
Potassium: 3.8 mmol/L (ref 3.5–5.1)
Sodium: 136 mmol/L (ref 135–145)

## 2019-11-07 LAB — CBC
HCT: 32.5 % — ABNORMAL LOW (ref 36.0–46.0)
Hemoglobin: 10.5 g/dL — ABNORMAL LOW (ref 12.0–15.0)
MCH: 27.4 pg (ref 26.0–34.0)
MCHC: 32.3 g/dL (ref 30.0–36.0)
MCV: 84.9 fL (ref 80.0–100.0)
Platelets: 212 10*3/uL (ref 150–400)
RBC: 3.83 MIL/uL — ABNORMAL LOW (ref 3.87–5.11)
RDW: 13.5 % (ref 11.5–15.5)
WBC: 8.8 10*3/uL (ref 4.0–10.5)
nRBC: 0 % (ref 0.0–0.2)

## 2019-11-07 LAB — POCT ACTIVATED CLOTTING TIME
Activated Clotting Time: 131 seconds
Activated Clotting Time: 246 seconds
Activated Clotting Time: 114 seconds

## 2019-11-07 LAB — ECHOCARDIOGRAM LIMITED
Height: 59 in
Weight: 2481.6 oz

## 2019-11-07 LAB — MAGNESIUM: Magnesium: 2.1 mg/dL (ref 1.7–2.4)

## 2019-11-07 MED FILL — Potassium Chloride Inj 2 mEq/ML: INTRAVENOUS | Qty: 40 | Status: AC

## 2019-11-07 MED FILL — Heparin Sodium (Porcine) Inj 1000 Unit/ML: INTRAMUSCULAR | Qty: 30 | Status: AC

## 2019-11-07 MED FILL — Magnesium Sulfate Inj 50%: INTRAMUSCULAR | Qty: 10 | Status: AC

## 2019-11-07 NOTE — Progress Notes (Signed)
CARDIAC REHAB PHASE I   PRE:  Rate/Rhythm: 84 SR  BP:  Sitting: 116/63      SaO2: 94 RA  MODE:  Ambulation: 400 ft   POST:  Rate/Rhythm: 102 ST  BP:  Sitting: 159/74    SaO2: 93 RA  Pt c/o nauseated but willing to ambulate in halls. Pt ambulated 328ft in hallway, then has several episode of emesis. Pt able to ambulate back to room and assisted to restroom. Pt returned to bed. Began d/c education, pt became too nauseated to continue. RN made aware. Pt with plans to stay with daughter for a few days. Will continue to follow.  [id number] Rufina Falco, RN BSN 11/07/2019 9:07 AM

## 2019-11-07 NOTE — Progress Notes (Signed)
  Echocardiogram 2D Echocardiogram has been performed.  Hannah Briggs 11/07/2019, 8:28 AM

## 2019-11-07 NOTE — Progress Notes (Signed)
1 Day Post-Op Procedure(s) (LRB): TRANSCATHETER AORTIC VALVE REPLACEMENT, TRANSFEMORAL (N/A) TRANSESOPHAGEAL ECHOCARDIOGRAM (TEE) (N/A) Subjective:  Feels well. Walked last night but not yet this am. No shortness of breath or chest pain.  Objective: Vital signs in last 24 hours: Temp:  [97.4 F (36.3 C)-98.7 F (37.1 C)] 97.5 F (36.4 C) (04/21 0833) Pulse Rate:  [0-281] 84 (04/21 0833) Cardiac Rhythm: Normal sinus rhythm (04/20 2000) Resp:  [0-30] 20 (04/21 0833) BP: (98-151)/(55-90) 127/63 (04/21 0833) SpO2:  [0 %-98 %] 93 % (04/21 0833) Weight:  [70.4 kg] 70.4 kg (04/21 0313)  Hemodynamic parameters for last 24 hours:    Intake/Output from previous day: 04/20 0701 - 04/21 0700 In: 1341.1 [I.V.:1241.1; IV Piggyback:100] Out: 220 [Urine:200; Blood:20] Intake/Output this shift: No intake/output data recorded.  General appearance: alert and cooperative Neurologic: intact Heart: regular rate and rhythm, S1, S2 normal, no murmur Lungs: clear to auscultation bilaterally Extremities: extremities normal, warm, no edema Wound: groin sites ok  Lab Results: Recent Labs    11/06/19 1137 11/07/19 0318  WBC  --  8.8  HGB 9.2* 10.5*  HCT 27.0* 32.5*  PLT  --  212   BMET:  Recent Labs    11/06/19 1137 11/07/19 0318  NA 135 136  K 4.0 3.8  CL 99 104  CO2  --  24  GLUCOSE 110* 111*  BUN 17 19  CREATININE 0.90 1.06*  CALCIUM  --  8.9    PT/INR: No results for input(s): LABPROT, INR in the last 72 hours. ABG    Component Value Date/Time   PHART 7.345 (L) 11/06/2019 1057   HCO3 26.0 11/06/2019 1057   TCO2 27 11/06/2019 1137   O2SAT 94.0 11/06/2019 1057   CBG (last 3)  Recent Labs    11/06/19 0709  GLUCAP 115*   ECG: sinus, no heart block  Assessment/Plan: S/P Procedure(s) (LRB): TRANSCATHETER AORTIC VALVE REPLACEMENT, TRANSFEMORAL (N/A) TRANSESOPHAGEAL ECHOCARDIOGRAM (TEE) (N/A)  POD 1 Hemodynamically stable in sinus rhythm. Can resume  antihypertensives at discharge.  ASA/Plavix for TAVR valve.  2D echo reviewed in room this am. Valve looks good with mean gradient 8, no paravalvular leak. Mean mitral gradient 13 mm Hg consistent with moderate MS.  Ambulate this am and plan home today.  LOS: 1 day    Gaye Pollack 11/07/2019

## 2019-11-07 NOTE — Discharge Summary (Addendum)
[facility] TEAM  Discharge Summary    Patient ID: Hannah Briggs MRN: [id number]; DOB: August 03, 1931  Admit date: 11/06/2019 Discharge date: 11/08/2019  Primary Care Provider: Renaldo Reel, PA  Primary Cardiologist: Dr. Bettina Gavia / Dr. Angelena Form & Dr. Cyndia Bent (TAVR)  Discharge Diagnoses    Principal Problem:   S/P TAVR (transcatheter aortic valve replacement) Active Problems:   Dyslipidemia   GERD (gastroesophageal reflux disease)   Severe aortic stenosis   CKD (chronic kidney disease), stage III   Pre-diabetes   Mitral stenosis   Acute on chronic diastolic heart failure (HCC)   Allergies Allergies  Allergen Reactions  . Claritin-D 12 Hour [Loratadine-Pseudoephedrine Er]     Pt was taking a 12 day course when she stopped course pt starting have rapid heart beat   . Codeine     "climb the wall"  . Lisinopril     Swelling of tongue   . Statins     Muscle and joint pain    Diagnostic Studies/Procedures    TAVR OPERATIVE NOTE   Date of Procedure:                11/06/2019  Preoperative Diagnosis:      Severe Aortic Stenosis   Postoperative Diagnosis:    Same   Procedure:        Transcatheter Aortic Valve Replacement - Percutaneous Right Transfemoral Approach             Edwards Sapien 3 Ultra THV (size 23 mm, model # 9750TFX, serial # [id number])              Co-Surgeons:                        Gaye Pollack, MD and Lauree Chandler, MD   Anesthesiologist:                  Belinda Block, MD  Echocardiographer:              Jenkins Rouge, MD  Pre-operative Echo Findings: ? Severe aortic stenosis ? Normal left ventricular systolic function  Post-operative Echo Findings: ? No paravalvular leak ? Normal left ventricular systolic function  _____________  Echo 11/07/19:  IMPRESSIONS 1. Left ventricular ejection fraction, by estimation, is 70 to 75%. The  left ventricle has hyperdynamic function.  There is moderate concentric  left ventricular hypertrophy. Left ventricular diastolic function could  not be evaluated.  2. Right ventricular systolic function is normal. The right ventricular  size is normal.  3. Left atrial size was mildly dilated.  4. The mitral valve is degenerative. No evidence of mitral valve  regurgitation. Moderate to severe mitral stenosis. The mean mitral valve  gradient is 13.1 mmHg with average heart rate of 79 bpm.  5. EOA 1.99 cm2. DI 0.78. LVOT VTI interrogated too distally. No  regurgitation or paravalvular leak. Normal functioning TAVR that is in  good position. The aortic valve has been repaired/replaced. Aortic valve  regurgitation is not visualized. There is a  23 mm Edwards Sapien prosthetic (TAVR) valve present in the aortic  position. Procedure Date: 11/06/2019. Aortic valve mean gradient measures  8.3 mmHg. Aortic valve Vmax measures 2.18 m/s.  6. The inferior vena cava is normal in size with <50% respiratory  variability, suggesting right atrial pressure of 8 mmHg.    History of Present Illness     Hannah Briggs is a 86  y.o. female with a history of CKD stage III, HTN, HLD, prediabetes, GERD, chronic diastolic CHF, moderate mitral stenosis and severe AS who presented to [facility] on 11/06/19 for planned TAVR.  The pt has a long history of aortic stenosis and mitral stenosis. She recently presented with a several month history of progressive exertional fatigue and shortness of breath.  She is able to do her ADLs in the house but has to move slowly and take rest break.  She had a follow-up echocardiogram on 08/29/2019 which showed an increase in the mean aortic valve gradient to 48 mmHg with a peak gradient of 79 mmHg.  Aortic valve area was measured at 0.82 cm by VTI.  Dimensionless index was 0.26.  The mitral valve mean gradient had also increased to 17.5 mmHg with a peak gradient of 30 mmHg. Valve area by pressure half-time is 2.73 cm.  There is no  mitral valve regurgitation. R/LHC on 10/15/19 showed mild non obstructive CAD, elevated wedge pressure and severe AS.   The patient has been evaluated by the multidisciplinary valve team and felt to have severe, symptomatic aortic stenosis and to be a suitable candidate for TAVR, which was set up for 11/06/19.     Hospital Course     Consultants: none  Severe AS: s/p successful TAVR with a 23 mm Edwards Sapien 3 Ultra THV via the TF approach on 11/06/19. Post operative echo showed EF 70%, normally functioning TAVR with a mean gradient of 8.3 mm hg and no PVL. Groin sites are stable. Initial post op ECG with a new LBBB, but now this has normalized. There is no high grade heart block. She was continued on home aspirin and clopidogrel 75 mg daily added.  Pt kept one more day given nausea. Plan for dsicharge home today with close outpatient follow up next week.  Mitral stenosis: moderate to severe MS by most recent echo. Mean gradient of 13 mm Hg  Acute on chronic diastolic CHF: as evidenced by an elevated BNP on pre admission lab work and progressive symptoms of dyspnea. This has been treated with TAVR.   HTN: resume home meds  Chronic anemia: Hg has remained stable ~ 9.5  Incidental findings: these will be addressed in the outpatient setting  small pulmonary nodules in the lungs, largest of which is a ground-glass attenuation nodule in the left upper lobe (1.6 x 1.0 cm). Initial follow-up with CT at 6-12 months is recommended to confirm persistence. If persistent, repeat CT is recommended every 2 years until 5 years of stability has been established.   1.3 x 0.9 x 1.2 cm low-attenuation lesion in the head of the pancreas. This is nonspecific, and may simply represent a tiny pancreatic pseudocyst. However, attention on follow-up studies is recommended. Follow-up abdominal MRI with and without IV gadolinium with MRCP or pancreatic protocol CT scan is recommended in 2 months to ensure the stability  of this finding _____________  Discharge Vitals Blood pressure (!) 146/75, pulse 79, temperature 98.3 F (36.8 C), temperature source Oral, resp. rate 18, height 4\' 11"  (1.499 m), weight 69.8 kg, SpO2 95 %.  Filed Weights   11/06/19 0740 11/07/19 0313 11/08/19 0213  Weight: 69.9 kg 70.4 kg 69.8 kg    Labs & Radiologic Studies    CBC Recent Labs    11/07/19 0318 11/08/19 0354  WBC 8.8 7.1  HGB 10.5* 9.5*  HCT 32.5* 29.5*  MCV 84.9 86.8  PLT 212 381   Basic Metabolic Panel Recent Labs  11/07/19 0318 11/08/19 0354  NA 136 136  K 3.8 3.6  CL 104 101  CO2 24 27  GLUCOSE 111* 121*  BUN 19 17  CREATININE 1.06* 0.90  CALCIUM 8.9 8.7*  MG 2.1  --    Liver Function Tests No results for input(s): AST, ALT, ALKPHOS, BILITOT, PROT, ALBUMIN in the last 72 hours. No results for input(s): LIPASE, AMYLASE in the last 72 hours. Cardiac Enzymes No results for input(s): CKTOTAL, CKMB, CKMBINDEX, TROPONINI in the last 72 hours. BNP Invalid input(s): POCBNP D-Dimer No results for input(s): DDIMER in the last 72 hours. Hemoglobin A1C No results for input(s): HGBA1C in the last 72 hours. Fasting Lipid Panel No results for input(s): CHOL, HDL, LDLCALC, TRIG, CHOLHDL, LDLDIRECT in the last 72 hours. Thyroid Function Tests No results for input(s): TSH, T4TOTAL, T3FREE, THYROIDAB in the last 72 hours.  Invalid input(s): FREET3 _____________  DG Chest 2 View  Result Date: 11/02/2019 CLINICAL DATA:  Severe aortic stenosis. Preop. EXAM: CHEST - 2 VIEW COMPARISON:  Chest CTA 10/18/2019 and chest radiographs 03/22/2018 FINDINGS: The cardiomediastinal silhouette is unchanged from the prior radiographs with upper limits of normal heart size. Aortic atherosclerosis is noted. There is mild chronic elevation of the right hemidiaphragm. No airspace consolidation, edema, pleural effusion, pneumothorax is identified. Thoracolumbar scoliosis is noted. IMPRESSION: No active cardiopulmonary disease.  Electronically Signed   By: Logan Bores M.D.   On: 11/02/2019 15:13   CARDIAC CATHETERIZATION  Result Date: 10/15/2019  Prox RCA lesion is 10% stenosed.  Dist RCA lesion is 10% stenosed.  Ost LM to Mid LM lesion is 10% stenosed.  Prox Cx lesion is 10% stenosed.  Mid LAD lesion is 20% stenosed.  1. Mild non-obstructive CAD 2. Elevated wedge pressure 3. Severe aortic stenosis by echo (cath hemodynamics mean gradient 25.6 mmHg, peak to peak gradient 36 mmHg, AVA 0.89cm2). Recommendations: Will continue workup for TAVR. We will contact her to arrange her CT scans, carotid dopplers, PT assessment and appointment with the CT surgeon. I will have her increase her HCTZ to 25 mg daily. If no resolution of dyspnea and mild LE edema over the next few days, will need to stop HCTZ and start Lasix.   CT CORONARY MORPH W/CTA COR W/SCORE W/CA W/CM &/OR WO/CM  Addendum Date: 10/18/2019   ADDENDUM REPORT: 10/18/2019 21:05 CLINICAL DATA:  Severe Aortic Stenosis. EXAM: Cardiac TAVR CT TECHNIQUE: The patient was scanned on a [employer]. A 120 kV retrospective scan was triggered in the descending thoracic aorta at 111 HU's. Gantry rotation speed was 250 msecs and collimation was .6 mm. No beta blockade or nitro were given. The 3D data set was reconstructed in 5% intervals of the R-R cycle. Systolic and diastolic phases were analyzed on a dedicated work station using MPR, MIP and VRT modes. The patient received 80 cc of contrast. FINDINGS: Image quality: Excellent. Noise artifact is: Limited. Valve Morphology: The aortic valve is tricuspid and severely calcified/thickened with restricted movement in systole. There is no protruding calcium. Aortic Valve Area: 1.02 cm2 Aortic Valve Calcium score: 937 AU Aortic annular dimension: Phase assessed: 30% Annular area: 364 mm2 Annular perimeter: 69.1 mm Max diameter: 23.6 mm Min diameter: 19.8 mm Annular and subannular calcification: No significant annular or sub-annular  calcifications. Optimal coplanar projection: LAO 13 CAU 8 Coronary Artery Height above Annulus: Left Main: 10.1 mm Right Coronary: 16.2 mm Sinus of Valsalva Measurements: Non-coronary: 27 mm Right-coronary: 26 mm Left-coronary: 27 mm Sinus of Valsalva  Height: Non-coronary: 22 mm Right-coronary: 18.1 mm Left-coronary: 18.5 mm Sinotubular Junction: 24 mm Ascending Thoracic Aorta: 29 mm Coronary Arteries: Normal coronary origin. Right dominance. The study was performed without use of NTG and is insufficient for plaque evaluation. Cardiac Morphology: Right Atrium: Right atrial size is dilated. Right Ventricle: The right ventricular cavity is within normal limits. Left Atrium: Left atrial size is dilated with no left atrial appendage filling defect. Left Ventricle: The ventricular cavity size is within normal limits. There are no stigmata of prior infarction. There is no abnormal filling defect. LVEF=77%, no regional wall motion abnormalities. Pulmonary arteries: Normal in size without proximal filling defect. Pulmonary veins: Normal pulmonary venous drainage. Pericardium: Normal thickness with no significant effusion or calcium present. Mitral Valve: The mitral valve is normal structure with severe mitral annular calcification. Extra-cardiac findings: See attached radiology report for non-cardiac structures. IMPRESSION: 1. Severely thickened and calcified aortic valve consistent with severe aortic stenosis. 2. Annular measurements appropriate for 23 mm Edwards Sapien 3 valve. 3. Borderline left main coronary height to annulus (10.1 mm). 4. Optimal Fluoroscopic Angle for Delivery: LAO 13 CAU 8. 5. Severe mitral annular calcification. <CurrentUser>, MD Electronically Signed   By: Eleonore Chiquito   On: 10/18/2019 21:05   Result Date: 10/18/2019 EXAM: OVER-READ INTERPRETATION  CT CHEST The following report is an over-read performed by radiologist Dr. Vinnie Langton of [facility], [place] on 10/18/2019. This over-read  does not include interpretation of cardiac or coronary anatomy or pathology. The coronary calcium score/coronary CTA interpretation by the cardiologist is attached. COMPARISON:  Chest CT 04/04/2018. FINDINGS: Extracardiac findings will be described separately under dictation for contemporaneously obtained CTA chest, abdomen and pelvis. IMPRESSION: Please see separate dictation for contemporaneously obtained CTA chest, abdomen and pelvis dated 10/18/2019 for full description of relevant extracardiac findings. Electronically Signed: By: Vinnie Langton M.D. On: 10/18/2019 12:07   CT ANGIO CHEST AORTA W/CM & OR WO/CM  Result Date: 10/18/2019 CLINICAL DATA:  86 year old female under preprocedural evaluation prior to potential transcatheter aortic valve replacement (TAVR) procedure. EXAM: CT ANGIOGRAPHY CHEST, ABDOMEN AND PELVIS TECHNIQUE: Multidetector CT imaging through the chest, abdomen and pelvis was performed using the standard protocol during bolus administration of intravenous contrast. Multiplanar reconstructed images and MIPs were obtained and reviewed to evaluate the vascular anatomy. CONTRAST:  169mL OMNIPAQUE IOHEXOL 350 MG/ML SOLN COMPARISON:  Chest CT 04/04/2018. FINDINGS: CTA CHEST FINDINGS Cardiovascular: Heart size is mildly enlarged. There is no significant pericardial fluid, thickening or pericardial calcification. There is aortic atherosclerosis, as well as atherosclerosis of the great vessels of the mediastinum and the coronary arteries, including calcified atherosclerotic plaque in the left main, left anterior descending and right coronary arteries. Thickening calcification of the aortic valve. Severe calcifications of the mitral annulus and mitral valve. Mediastinum/Lymph Nodes: No pathologically enlarged mediastinal or hilar lymph nodes. Moderate hiatal hernia. No axillary lymphadenopathy. Lungs/Pleura: Small pulmonary nodules are noted in the lungs, largest of which is in the left upper lobe  (axial image 28 of series 8), measuring 6 x 6 mm. In addition, there is a ground-glass attenuation 1.6 x 1.0 cm lesion in the left upper lobe (axial image 34 of series 8). Patchy areas of ground-glass attenuation and interlobular septal thickening noted throughout the lungs bilaterally. No acute consolidative airspace disease. No pleural effusions. Musculoskeletal/Soft Tissues: There are no aggressive appearing lytic or blastic lesions noted in the visualized portions of the skeleton. CTA ABDOMEN AND PELVIS FINDINGS Hepatobiliary: No suspicious cystic or solid hepatic lesions.  No intra or extrahepatic biliary ductal dilatation. Status post cholecystectomy. Pancreas: In the head of the pancreas (axial image 114 of series 7 and coronal image 66 of series 9) there is a 1.3 x 0.9 x 1.2 cm low-attenuation lesion in close proximity to the common bile duct. No other pancreatic mass. No pancreatic ductal dilatation. No peripancreatic fluid collections or inflammatory changes. Spleen: Unremarkable. Adrenals/Urinary Tract: Bilateral kidneys and bilateral adrenal glands are normal in appearance. No hydroureteronephrosis. Small amount of gas non dependently in the urinary bladder. Otherwise, urinary bladder is normal in appearance. Stomach/Bowel: Normal appearance of the stomach. No pathologic dilatation of small bowel or colon. Numerous colonic diverticulae are noted, particularly in the sigmoid colon, without surrounding inflammatory changes to suggest an acute diverticulitis at this time. The appendix is not confidently identified and may be surgically absent. Regardless, there are no inflammatory changes noted adjacent to the cecum to suggest the presence of an acute appendicitis at this time. Vascular/Lymphatic: Aortic atherosclerosis, with vascular findings and measurements pertinent to potential TAVR procedure, as detailed below. No aneurysm or dissection noted in the abdominal or pelvic vasculature. No lymphadenopathy  noted in the abdomen or pelvis. Reproductive: Status post hysterectomy. Ovaries are not confidently identified may be surgically absent or trophic. Other: No significant volume of ascites.  No pneumoperitoneum. Musculoskeletal: There are no aggressive appearing lytic or blastic lesions noted in the visualized portions of the skeleton. VASCULAR MEASUREMENTS PERTINENT TO TAVR: AORTA: Minimal Aortic Diameter-13 x 10 mm Severity of Aortic Calcification-moderate to severe RIGHT PELVIS: Right Common Iliac Artery - Minimal Diameter-8.3 x 7.9 mm Tortuosity-mild Calcification-mild Right External Iliac Artery - Minimal Diameter-5.9 x 6.6 mm Tortuosity-mild-to-moderate Calcification-none Right Common Femoral Artery - Minimal Diameter-6.6 x 6.7 mm Tortuosity-mild Calcification-mild LEFT PELVIS: Left Common Iliac Artery - Minimal Diameter-9.2 x 8.6 mm Tortuosity-mild Calcification-mild Left External Iliac Artery - Minimal Diameter-6.1 x 6.1 mm Tortuosity-mild-to-moderate Calcification-none Left Common Femoral Artery - Minimal Diameter-6.7 x 6.9 mm Tortuosity-mild Calcification-mild Review of the MIP images confirms the above findings. IMPRESSION: 1. Vascular findings and measurements pertinent to potential TAVR procedure, as detailed above. 2. Thickening calcification of the aortic valve, compatible with the reported clinical history of severe aortic stenosis. 3. Severe calcifications also noted of the mitral annulus and mitral valve. 4. Aortic atherosclerosis, in addition to left main and 2 vessel coronary artery disease. 5. Small pulmonary nodules in the lungs, largest of which is a ground-glass attenuation nodule in the left upper lobe (1.6 x 1.0 cm). Initial follow-up with CT at 6-12 months is recommended to confirm persistence. If persistent, repeat CT is recommended every 2 years until 5 years of stability has been established. This recommendation follows the consensus statement: Guidelines for Management of Incidental  Pulmonary Nodules Detected on CT Images: From the Fleischner Society 2017; Radiology 2017; 284:228-243. 6. The appearance of the lungs suggests mild interstitial pulmonary edema. Mild cardiomegaly. Imaging findings suggestive of mild congestive heart failure. 7. 1.3 x 0.9 x 1.2 cm low-attenuation lesion in the head of the pancreas. This is nonspecific, and may simply represent a tiny pancreatic pseudocyst. However, attention on follow-up studies is recommended. Follow-up abdominal MRI with and without IV gadolinium with MRCP or pancreatic protocol CT scan is recommended in 2 months to ensure the stability of this finding. This recommendation follows ACR consensus guidelines: Management of Incidental Pancreatic Cysts: A White Paper of the ACR Incidental Findings Committee. J Am Coll Radiol 4656;81:275-170. 8. Severe colonic diverticulosis without evidence of acute diverticulitis at this  time. 9. Additional incidental findings, as above. Electronically Signed   By: Vinnie Langton M.D.   On: 10/18/2019 14:23   VAS US CAROTID  Result Date: 10/18/2019 Carotid Arterial Duplex Study Indications:       Pre-surgical evaluation. Other Factors:     TAVR. Comparison Study:  No prior study Performing Technologist: Maudry Mayhew MHA, RDMS, RVT, RDCS  Examination Guidelines: A complete evaluation includes B-mode imaging, spectral Doppler, color Doppler, and power Doppler as needed of all accessible portions of each vessel. Bilateral testing is considered an integral part of a complete examination. Limited examinations for reoccurring indications may be performed as noted.  Right Carotid Findings: +----------+--------+--------+--------+------------------+--------+           PSV cm/sEDV cm/sStenosisPlaque DescriptionComments +----------+--------+--------+--------+------------------+--------+ CCA Prox  112     22                                          +----------+--------+--------+--------+------------------+--------+ CCA Distal76      18                                         +----------+--------+--------+--------+------------------+--------+ ICA Prox  54      13                                         +----------+--------+--------+--------+------------------+--------+ ICA Distal33      10                                         +----------+--------+--------+--------+------------------+--------+ ECA       94      10                                         +----------+--------+--------+--------+------------------+--------+ +----------+--------+-------+----------------+-------------------+           PSV cm/sEDV cmsDescribe        Arm Pressure (mmHG) +----------+--------+-------+----------------+-------------------+ [id number]            Multiphasic, WNL                    +----------+--------+-------+----------------+-------------------+ +---------+--------+--+--------+--+---------+ VertebralPSV cm/s50EDV cm/s12Antegrade +---------+--------+--+--------+--+---------+  Left Carotid Findings: +----------+--------+--------+--------+-----------------------+--------+           PSV cm/sEDV cm/sStenosisPlaque Description     Comments +----------+--------+--------+--------+-----------------------+--------+ CCA Prox  122     13                                              +----------+--------+--------+--------+-----------------------+--------+ CCA Distal96      17                                              +----------+--------+--------+--------+-----------------------+--------+ ICA Prox  62      18  smooth and heterogenous         +----------+--------+--------+--------+-----------------------+--------+ ICA Distal66      18                                              +----------+--------+--------+--------+-----------------------+--------+ ECA       100     8                                                +----------+--------+--------+--------+-----------------------+--------+ +----------+--------+--------+----------------+-------------------+           PSV cm/sEDV cm/sDescribe        Arm Pressure (mmHG) +----------+--------+--------+----------------+-------------------+ [id number]             Multiphasic, WNL                    +----------+--------+--------+----------------+-------------------+ +---------+--------+--+--------+-+---------+ VertebralPSV cm/s40EDV cm/s8Antegrade +---------+--------+--+--------+-+---------+   Summary: Right Carotid: Velocities in the right ICA are consistent with a 1-39% stenosis. Left Carotid: Velocities in the left ICA are consistent with a 1-39% stenosis. Vertebrals:  Bilateral vertebral arteries demonstrate antegrade flow. Subclavians: Normal flow hemodynamics were seen in bilateral subclavian              arteries. *See table(s) above for measurements and observations.  Electronically signed by Antony Contras MD on 10/18/2019 at 2:19:56 PM.    Final    ECHOCARDIOGRAM LIMITED  Result Date: 11/07/2019    ECHOCARDIOGRAM LIMITED REPORT   Patient Name:   Hannah Briggs Date of Exam: 11/07/2019 Medical Rec #:  [id number]     Height:       59.0 in Accession #:    [id number]    Weight:       155.1 lb Date of Birth:  25-Apr-1932     BSA:          1.655 m Patient Age:    66 years      BP:           140/68 mmHg Patient Gender: F             HR:           83 bpm. Exam Location:  Inpatient Procedure: Limited Echo, Cardiac Doppler and Color Doppler                            MODIFIED REPORT: This report was modified by Eleonore Chiquito MD on 11/07/2019 due to error.  Indications:     I35.0 Nonrheumatic aortic (valve) stenosis. Post TAVR                  procedure.  History:         Patient has prior history of Echocardiogram examinations, most                  recent 11/06/2019. Abnormal ECG, Aortic Valve Disease and Mitral                  Valve  Disease, Arrythmias:LBBB; Signs/Symptoms:Dyspnea. Severe                  aortic stenosis. Mitral stenosis. Post TAVR procedure using  69mm Edwards Sapien ES3 Ultra valve in the aortic position.                  Aortic Valve: 23 mm Edwards Sapien prosthetic, stented (TAVR)                  valve is present in the aortic position. Procedure Date:                  11/06/2019.  Sonographer:     Roseanna Rainbow RDCS Referring Phys:  [id number] [place] Diagnosing Phys: Eleonore Chiquito MD IMPRESSIONS  1. Left ventricular ejection fraction, by estimation, is 70 to 75%. The left ventricle has hyperdynamic function. There is moderate concentric left ventricular hypertrophy. Left ventricular diastolic function could not be evaluated.  2. Right ventricular systolic function is normal. The right ventricular size is normal.  3. Left atrial size was mildly dilated.  4. The mitral valve is degenerative. No evidence of mitral valve regurgitation. Moderate to severe mitral stenosis. The mean mitral valve gradient is 13.1 mmHg with average heart rate of 79 bpm.  5. EOA 1.99 cm2. DI 0.78. LVOT VTI interrogated too distally. No regurgitation or paravalvular leak. Normal functioning TAVR that is in good position. The aortic valve has been repaired/replaced. Aortic valve regurgitation is not visualized. There is a 23 mm Edwards Sapien prosthetic (TAVR) valve present in the aortic position. Procedure Date: 11/06/2019. Aortic valve mean gradient measures 8.3 mmHg. Aortic valve Vmax measures 2.18 m/s.  6. The inferior vena cava is normal in size with <50% respiratory variability, suggesting right atrial pressure of 8 mmHg. FINDINGS  Left Ventricle: Left ventricular ejection fraction, by estimation, is 70 to 75%. The left ventricle has hyperdynamic function. There is moderate concentric left ventricular hypertrophy. Left ventricular diastolic function could not be evaluated due to mitral annular calcification (moderate or  greater). Right Ventricle: The right ventricular size is normal. No increase in right ventricular wall thickness. Right ventricular systolic function is normal. Left Atrium: Left atrial size was mildly dilated. Pericardium: Trivial pericardial effusion is present. Presence of pericardial fat pad. Mitral Valve: The mitral valve is degenerative in appearance. Severe mitral annular calcification. Moderate to severe mitral valve stenosis. MV peak gradient, 33.3 mmHg. The mean mitral valve gradient is 13.1 mmHg with average heart rate of 79 bpm. Tricuspid Valve: The tricuspid valve is grossly normal. Tricuspid valve regurgitation is trivial. No evidence of tricuspid stenosis. Aortic Valve: EOA 1.99 cm2. DI 0.78. LVOT VTI interrogated too distally. No regurgitation or paravalvular leak. Normal functioning TAVR that is in good position. The aortic valve has been repaired/replaced. Aortic valve regurgitation is not visualized. Aortic valve mean gradient measures 8.3 mmHg. Aortic valve peak gradient measures 19.0 mmHg. Aortic valve area, by VTI measures 1.99 cm. There is a 23 mm Edwards Sapien prosthetic, stented (TAVR) valve present in the aortic position. Procedure Date: 11/06/2019. Aorta: The aortic root and ascending aorta are structurally normal, with no evidence of dilitation. Venous: The inferior vena cava is normal in size with less than 50% respiratory variability, suggesting right atrial pressure of 8 mmHg. IAS/Shunts: The atrial septum is grossly normal.  LEFT VENTRICLE PLAX 2D LVIDd:         2.90 cm  Diastology LVIDs:         1.80 cm  LV e' lateral:   5.51 cm/s LV PW:         1.50 cm  LV E/e' lateral: 41.4 LV IVS:  1.40 cm  LV e' medial:    3.97 cm/s LVOT diam:     1.80 cm  LV E/e' medial:  57.4 LV SV:         75 LV SV Index:   45 LVOT Area:     2.54 cm  IVC IVC diam: 2.00 cm LEFT ATRIUM           Index LA diam:      4.60 cm 2.78 cm/m LA Vol (A4C): 62.2 ml 37.57 ml/m  AORTIC VALVE AV Area (Vmax):    1.73  cm AV Area (Vmean):   2.14 cm AV Area (VTI):     1.99 cm AV Vmax:           218.00 cm/s AV Vmean:          134.333 cm/s AV VTI:            0.376 m AV Peak Grad:      19.0 mmHg AV Mean Grad:      8.3 mmHg LVOT Vmax:         148.00 cm/s LVOT Vmean:        113.000 cm/s LVOT VTI:          0.294 m LVOT/AV VTI ratio: 0.78  AORTA Ao Root diam: 3.00 cm Ao Asc diam:  3.00 cm MITRAL VALVE MV Area (PHT): 1.70 cm     SHUNTS MV Peak grad:  33.3 mmHg    Systemic VTI:  0.29 m MV Mean grad:  13.1 mmHg    Systemic Diam: 1.80 cm MV Vmax:       2.89 m/s MV Vmean:      152.7 cm/s MV Decel Time: 447 msec MV E velocity: 228.00 cm/s MV A velocity: 221.00 cm/s MV E/A ratio:  1.03 Eleonore Chiquito MD Electronically signed by Eleonore Chiquito MD Signature Date/Time: 11/07/2019/2:35:42 PM    Final (Updated)    ECHOCARDIOGRAM LIMITED  Result Date: 11/06/2019    ECHOCARDIOGRAM LIMITED REPORT   Patient Name:   Hannah Briggs Date of Exam: 11/06/2019 Medical Rec #:  [id number]     Height:       59.0 in Accession #:    [id number]    Weight:       154.1 lb Date of Birth:  1932-05-10     BSA:          1.651 m Patient Age:    39 years      BP:           156/60 mmHg Patient Gender: F             HR:           103 bpm. Exam Location:  Inpatient Procedure: Limited Echo, Color Doppler and Cardiac Doppler Indications:     Aortic Stenosis i35.0  History:         Patient has prior history of Echocardiogram examinations, most                  recent 08/30/2019. Risk Factors:Hypertension and Dyslipidemia.  Sonographer:     Raquel Sarna Senior RDCS Referring Phys:  [id number] Woodfin Ganja Amara Justen Diagnosing Phys: Jenkins Rouge MD  Sonographer Comments: 56mm Ultra Sapien TAVR IMPRESSIONS  1. Left ventricular ejection fraction, by estimation, is 65 to 70%. The left ventricle has normal function. The left ventricle has no regional wall motion abnormalities. There is moderate asymmetric left ventricular hypertrophy of the basal and septal segments.  2. MV not interogated but  preop TTE with moderate MS. Trivial mitral valve regurgitation. Moderate mitral stenosis.  3. Pre TAVR: tri leaflet valve with severe AS peak velocity 3.5 m/sec mean gradient 32 mmHg peak 49 mmHg AVA 0.8 cm2         Post TAVR: well positioned 23 mm Sapien 3 Ultra valve with no peri valvular regurgitation Peak velocity 1.6 m/sec mean gradient 6 mmHg peak 10 mmHg AVA 1.45 cm2. Some SAM with LVOT gradient and bifid CW signal across AVR Suggested to anesthesia to give volume as LV is hypertrophied and underfilled from MS . The aortic valve is tricuspid. Severe aortic valve stenosis. FINDINGS  Left Ventricle: Left ventricular ejection fraction, by estimation, is 65 to 70%. The left ventricle has normal function. The left ventricle has no regional wall motion abnormalities. There is moderate asymmetric left ventricular hypertrophy of the basal  and septal segments. Pericardium: There is no evidence of pericardial effusion. Mitral Valve: MV not interogated but preop TTE with moderate MS. There is severe thickening of the mitral valve leaflet(s). There is severe calcification of the mitral valve leaflet(s). Severe mitral annular calcification. Trivial mitral valve regurgitation. Moderate mitral valve stenosis. Tricuspid Valve: Tricuspid valve regurgitation is mild. Aortic Valve: Pre TAVR: tri leaflet valve with severe AS peak velocity 3.5 m/sec mean gradient 32 mmHg peak 49 mmHg AVA 0.8 cm2 Post TAVR: well positioned 23 mm Sapien 3 Ultra valve with no peri valvular regurgitation Peak velocity 1.6 m/sec mean gradient 6 mmHg peak 10 mmHg AVA 1.45 cm2. Some SAM with LVOT gradient and bifid CW signal across AVR Suggested to anesthesia to give volume as LV is hypertrophied and underfilled from MS. The aortic valve is tricuspid. . There is severe thickening and severe calcifcation of the aortic valve. Severe aortic stenosis is present. Severe aortic valve annular calcification. There is severe thickening of the aortic valve.  There is severe calcifcation of the aortic valve. Aortic valve mean gradient measures 6.0 mmHg. Aortic valve peak gradient measures 10.4 mmHg. Aortic valve area, by VTI measures 1.37 cm. Pulmonic Valve: The pulmonic valve was not assessed.  LEFT VENTRICLE PLAX 2D LVOT diam:     1.80 cm LV SV:         49 LV SV Index:   30 LVOT Area:     2.54 cm  AORTIC VALVE AV Area (Vmax):    1.45 cm AV Area (Vmean):   1.55 cm AV Area (VTI):     1.37 cm AV Vmax:           161.00 cm/s AV Vmean:          115.000 cm/s AV VTI:            0.360 m AV Peak Grad:      10.4 mmHg AV Mean Grad:      6.0 mmHg LVOT Vmax:         92.00 cm/s LVOT Vmean:        70.200 cm/s LVOT VTI:          0.194 m LVOT/AV VTI ratio: 0.54  SHUNTS Systemic VTI:  0.19 m Systemic Diam: 1.80 cm Jenkins Rouge MD Electronically signed by Jenkins Rouge MD Signature Date/Time: 11/06/2019/11:01:58 AM    Final    Structural Heart Procedure  Result Date: 11/06/2019 See surgical note for result.  CT ANGIO ABDOMEN PELVIS  W &/OR WO CONTRAST  Result Date: 10/18/2019 CLINICAL DATA:  86 year old female under preprocedural evaluation prior to potential transcatheter aortic valve replacement (TAVR) procedure.  EXAM: CT ANGIOGRAPHY CHEST, ABDOMEN AND PELVIS TECHNIQUE: Multidetector CT imaging through the chest, abdomen and pelvis was performed using the standard protocol during bolus administration of intravenous contrast. Multiplanar reconstructed images and MIPs were obtained and reviewed to evaluate the vascular anatomy. CONTRAST:  137mL OMNIPAQUE IOHEXOL 350 MG/ML SOLN COMPARISON:  Chest CT 04/04/2018. FINDINGS: CTA CHEST FINDINGS Cardiovascular: Heart size is mildly enlarged. There is no significant pericardial fluid, thickening or pericardial calcification. There is aortic atherosclerosis, as well as atherosclerosis of the great vessels of the mediastinum and the coronary arteries, including calcified atherosclerotic plaque in the left main, left anterior descending  and right coronary arteries. Thickening calcification of the aortic valve. Severe calcifications of the mitral annulus and mitral valve. Mediastinum/Lymph Nodes: No pathologically enlarged mediastinal or hilar lymph nodes. Moderate hiatal hernia. No axillary lymphadenopathy. Lungs/Pleura: Small pulmonary nodules are noted in the lungs, largest of which is in the left upper lobe (axial image 28 of series 8), measuring 6 x 6 mm. In addition, there is a ground-glass attenuation 1.6 x 1.0 cm lesion in the left upper lobe (axial image 34 of series 8). Patchy areas of ground-glass attenuation and interlobular septal thickening noted throughout the lungs bilaterally. No acute consolidative airspace disease. No pleural effusions. Musculoskeletal/Soft Tissues: There are no aggressive appearing lytic or blastic lesions noted in the visualized portions of the skeleton. CTA ABDOMEN AND PELVIS FINDINGS Hepatobiliary: No suspicious cystic or solid hepatic lesions. No intra or extrahepatic biliary ductal dilatation. Status post cholecystectomy. Pancreas: In the head of the pancreas (axial image 114 of series 7 and coronal image 66 of series 9) there is a 1.3 x 0.9 x 1.2 cm low-attenuation lesion in close proximity to the common bile duct. No other pancreatic mass. No pancreatic ductal dilatation. No peripancreatic fluid collections or inflammatory changes. Spleen: Unremarkable. Adrenals/Urinary Tract: Bilateral kidneys and bilateral adrenal glands are normal in appearance. No hydroureteronephrosis. Small amount of gas non dependently in the urinary bladder. Otherwise, urinary bladder is normal in appearance. Stomach/Bowel: Normal appearance of the stomach. No pathologic dilatation of small bowel or colon. Numerous colonic diverticulae are noted, particularly in the sigmoid colon, without surrounding inflammatory changes to suggest an acute diverticulitis at this time. The appendix is not confidently identified and may be  surgically absent. Regardless, there are no inflammatory changes noted adjacent to the cecum to suggest the presence of an acute appendicitis at this time. Vascular/Lymphatic: Aortic atherosclerosis, with vascular findings and measurements pertinent to potential TAVR procedure, as detailed below. No aneurysm or dissection noted in the abdominal or pelvic vasculature. No lymphadenopathy noted in the abdomen or pelvis. Reproductive: Status post hysterectomy. Ovaries are not confidently identified may be surgically absent or trophic. Other: No significant volume of ascites.  No pneumoperitoneum. Musculoskeletal: There are no aggressive appearing lytic or blastic lesions noted in the visualized portions of the skeleton. VASCULAR MEASUREMENTS PERTINENT TO TAVR: AORTA: Minimal Aortic Diameter-13 x 10 mm Severity of Aortic Calcification-moderate to severe RIGHT PELVIS: Right Common Iliac Artery - Minimal Diameter-8.3 x 7.9 mm Tortuosity-mild Calcification-mild Right External Iliac Artery - Minimal Diameter-5.9 x 6.6 mm Tortuosity-mild-to-moderate Calcification-none Right Common Femoral Artery - Minimal Diameter-6.6 x 6.7 mm Tortuosity-mild Calcification-mild LEFT PELVIS: Left Common Iliac Artery - Minimal Diameter-9.2 x 8.6 mm Tortuosity-mild Calcification-mild Left External Iliac Artery - Minimal Diameter-6.1 x 6.1 mm Tortuosity-mild-to-moderate Calcification-none Left Common Femoral Artery - Minimal Diameter-6.7 x 6.9 mm Tortuosity-mild Calcification-mild Review of the MIP images confirms the above findings. IMPRESSION: 1. Vascular findings and  measurements pertinent to potential TAVR procedure, as detailed above. 2. Thickening calcification of the aortic valve, compatible with the reported clinical history of severe aortic stenosis. 3. Severe calcifications also noted of the mitral annulus and mitral valve. 4. Aortic atherosclerosis, in addition to left main and 2 vessel coronary artery disease. 5. Small pulmonary  nodules in the lungs, largest of which is a ground-glass attenuation nodule in the left upper lobe (1.6 x 1.0 cm). Initial follow-up with CT at 6-12 months is recommended to confirm persistence. If persistent, repeat CT is recommended every 2 years until 5 years of stability has been established. This recommendation follows the consensus statement: Guidelines for Management of Incidental Pulmonary Nodules Detected on CT Images: From the Fleischner Society 2017; Radiology 2017; 284:228-243. 6. The appearance of the lungs suggests mild interstitial pulmonary edema. Mild cardiomegaly. Imaging findings suggestive of mild congestive heart failure. 7. 1.3 x 0.9 x 1.2 cm low-attenuation lesion in the head of the pancreas. This is nonspecific, and may simply represent a tiny pancreatic pseudocyst. However, attention on follow-up studies is recommended. Follow-up abdominal MRI with and without IV gadolinium with MRCP or pancreatic protocol CT scan is recommended in 2 months to ensure the stability of this finding. This recommendation follows ACR consensus guidelines: Management of Incidental Pancreatic Cysts: A White Paper of the ACR Incidental Findings Committee. J Am Coll Radiol 0258;52:778-242. 8. Severe colonic diverticulosis without evidence of acute diverticulitis at this time. 9. Additional incidental findings, as above. Electronically Signed   By: Vinnie Langton M.D.   On: 10/18/2019 14:23   Disposition   Pt is being discharged home today in good condition.  Follow-up Plans & Appointments    Follow-up Information    Richardo Priest, MD. Go on 11/13/2019.   Specialty: Cardiology Why: @ 1:15pm, please arrive at least 10 minutes early Contact information: [place] [id number] [phone]            Discharge Medications   Allergies as of 11/08/2019      Reactions   Claritin-d 12 Hour [loratadine-pseudoephedrine Er]    Pt was taking a 12 day course when she stopped course pt  starting have rapid heart beat    Codeine    "climb the wall"   Lisinopril    Swelling of tongue    Statins    Muscle and joint pain      Medication List    TAKE these medications   aluminum hydroxide-magnesium carbonate 95-358 MG/15ML Susp Commonly known as: GAVISCON Take 30 mLs by mouth at bedtime. Hiatal hernia   amLODipine 10 MG tablet Commonly known as: NORVASC Take 10 mg by mouth daily.   aspirin EC 81 MG tablet Take 81 mg by mouth daily.   benazepril 40 MG tablet Commonly known as: LOTENSIN Take 40 mg by mouth daily.   Benefiber Drink Mix Pack Take 1 Package by mouth every other day as needed (constipation.).   CALCIUM 600+D3 PO Take 1 tablet by mouth daily.   calcium carbonate 750 MG chewable tablet Commonly known as: TUMS EX Chew 1 tablet by mouth 4 (four) times daily as needed for heartburn.   Vitamin D-3 125 MCG (5000 UT) Tabs Take 5,000 Units by mouth daily.   cholecalciferol 25 MCG (1000 UNIT) tablet Commonly known as: VITAMIN D Take 1,000 Units by mouth daily.   clopidogrel 75 MG tablet Commonly known as: PLAVIX Take 1 tablet (75 mg total) by mouth daily with breakfast.   CoQ10  100 MG Caps Take 100 mg by mouth at bedtime.   ezetimibe 10 MG tablet Commonly known as: ZETIA Take 10 mg by mouth every evening.   fenofibrate 145 MG tablet Commonly known as: TRICOR Take 145 mg by mouth every Tuesday, Thursday, Saturday, and Sunday at 6 PM.   hydrochlorothiazide 12.5 MG capsule Commonly known as: MICROZIDE Take 12.5 mg by mouth daily.   Iron 27 240 (27 FE) MG tablet Generic drug: ferrous gluconate Take 27 mg by mouth daily.   montelukast 10 MG tablet Commonly known as: SINGULAIR Take 10 mg by mouth daily as needed (sinus drainage).   multivitamin with minerals Tabs tablet Take 1 tablet by mouth daily.   nystatin cream Commonly known as: MYCOSTATIN Apply 1 application topically 2 (two) times daily as needed (irritation).   omeprazole  20 MG capsule Commonly known as: PRILOSEC Take 20 mg by mouth daily before breakfast.   OVER THE COUNTER MEDICATION Take 1,500-3,000 mg by mouth See admin instructions. (D-mannose) Take 1 capsule (1500 mg) by mouth in the morning & take 2 capsules (3000 mg) by mouth at night.   Potassium 99 MG Tabs Take 99 mg by mouth daily.   pravastatin 20 MG tablet Commonly known as: PRAVACHOL Take 40 mg by mouth every Monday, Wednesday, and Friday. At night   PROBIOTIC PO Take 1 capsule by mouth daily.   tiZANidine 2 MG tablet Commonly known as: ZANAFLEX Take 2 mg by mouth at bedtime as needed for muscle spasms.   Turmeric 500 MG Caps Take 500 mg by mouth daily.           Outstanding Labs/Studies   none  Duration of Discharge Encounter   Greater than 30 minutes including physician time.  SignedAngelena Form, PA-C 11/08/2019, 8:16 AM [phone]

## 2019-11-08 ENCOUNTER — Encounter (HOSPITAL_COMMUNITY): Payer: Self-pay | Admitting: Surgery

## 2019-11-08 LAB — BASIC METABOLIC PANEL
Anion gap: 8 (ref 5–15)
BUN: 17 mg/dL (ref 8–23)
CO2: 27 mmol/L (ref 22–32)
Calcium: 8.7 mg/dL — ABNORMAL LOW (ref 8.9–10.3)
Chloride: 101 mmol/L (ref 98–111)
Creatinine, Ser: 0.9 mg/dL (ref 0.44–1.00)
GFR calc Af Amer: >60 mL/min (ref >60)
GFR calc non Af Amer: 58 mL/min — ABNORMAL LOW (ref >60)
Glucose, Bld: 121 mg/dL — ABNORMAL HIGH (ref 70–99)
Potassium: 3.6 mmol/L (ref 3.5–5.1)
Sodium: 136 mmol/L (ref 135–145)

## 2019-11-08 LAB — CBC
HCT: 29.5 % — ABNORMAL LOW (ref 36.0–46.0)
Hemoglobin: 9.5 g/dL — ABNORMAL LOW (ref 12.0–15.0)
MCH: 27.9 pg (ref 26.0–34.0)
MCHC: 32.2 g/dL (ref 30.0–36.0)
MCV: 86.8 fL (ref 80.0–100.0)
Platelets: 168 10*3/uL (ref 150–400)
RBC: 3.4 MIL/uL — ABNORMAL LOW (ref 3.87–5.11)
RDW: 13.6 % (ref 11.5–15.5)
WBC: 7.1 10*3/uL (ref 4.0–10.5)
nRBC: 0 % (ref 0.0–0.2)

## 2019-11-08 MED ORDER — CLOPIDOGREL BISULFATE 75 MG PO TABS: 75.0000 mg | ORAL_TABLET | Freq: Every day | ORAL | 1 refills | Status: DC

## 2019-11-08 NOTE — Progress Notes (Signed)
Order received to discharge patient.  Telemetry monitor removed and CCMD notified.  PIV access removed.  Discharge instructions, follow up, medications and instructions for their use discussed with patient. 

## 2019-11-08 NOTE — Progress Notes (Signed)
CARDIAC REHAB PHASE I   Offered to walk with pt, pt states she is leaving today and feeling much better then yesterday. Denies nausea, SOB, or pain. Reviewed site care, restrictions, and encouraged ambulation as able. Pt states she has a good support system and a lot to keep going for. Pt declines CRP II at this time.  [id number] Hannah Falco, RN BSN 11/08/2019 9:14 AM

## 2019-11-08 NOTE — Progress Notes (Signed)
2 Days Post-Op Procedure(s) (LRB): TRANSCATHETER AORTIC VALVE REPLACEMENT, TRANSFEMORAL (N/A) TRANSESOPHAGEAL ECHOCARDIOGRAM (TEE) (N/A) Subjective: Feels great. Nausea resolved yesterday am. Eating well and walked again this am without difficulty. Anxious to go home with family.  Objective: Vital signs in last 24 hours: Temp:  [97.5 F (36.4 C)-98.3 F (36.8 C)] 98.3 F (36.8 C) (04/22 0604) Pulse Rate:  [79-87] 79 (04/22 0604) Cardiac Rhythm: Normal sinus rhythm (04/22 0604) Resp:  [15-20] 18 (04/22 0604) BP: (127-146)/(63-75) 146/75 (04/22 0604) SpO2:  [93 %-96 %] 95 % (04/22 0604) Weight:  [69.8 kg] 69.8 kg (04/22 0213)  Hemodynamic parameters for last 24 hours:    Intake/Output from previous day: 04/21 0701 - 04/22 0700 In: 660 [P.O.:360; IV Piggyback:300] Out: -  Intake/Output this shift: No intake/output data recorded.  General appearance: alert and cooperative Neurologic: intact Heart: regular rate and rhythm, S1, S2 normal, no murmur, click, rub or gallop Lungs: clear to auscultation bilaterally Extremities: extremities normal Wound: groins ok  Lab Results: Recent Labs    11/07/19 0318 11/08/19 0354  WBC 8.8 7.1  HGB 10.5* 9.5*  HCT 32.5* 29.5*  PLT 212 168   BMET:  Recent Labs    11/07/19 0318 11/08/19 0354  NA 136 136  K 3.8 3.6  CL 104 101  CO2 24 27  GLUCOSE 111* 121*  BUN 19 17  CREATININE 1.06* 0.90  CALCIUM 8.9 8.7*    PT/INR: No results for input(s): LABPROT, INR in the last 72 hours. ABG    Component Value Date/Time   PHART 7.345 (L) 11/06/2019 1057   HCO3 26.0 11/06/2019 1057   TCO2 27 11/06/2019 1137   O2SAT 94.0 11/06/2019 1057   CBG (last 3)  Recent Labs    11/06/19 0709  GLUCAP 115*    Assessment/Plan: S/P Procedure(s) (LRB): TRANSCATHETER AORTIC VALVE REPLACEMENT, TRANSFEMORAL (N/A) TRANSESOPHAGEAL ECHOCARDIOGRAM (TEE) (N/A)  Hemodynamically stable in sinus rhythm with no signs of heart block.  Nausea/vomiting  resolved.   Plan home this am.   LOS: 2 days    Gaye Pollack 11/08/2019

## 2019-11-09 ENCOUNTER — Telehealth: Payer: Self-pay | Admitting: Physician Assistant

## 2019-11-09 ENCOUNTER — Telehealth: Payer: Self-pay

## 2019-11-09 NOTE — Telephone Encounter (Signed)
  [place] VALVE TEAM   Patient contacted regarding discharge from [facility] on 11/08/19  Patient understands to follow up with provider Dr. Bettina Gavia 2/27 at [place].  Patient understands discharge instructions? yes Patient understands medications and regimen? yes Patient understands to bring all medications to this visit? yes  Angelena Form PA-C  [facility]

## 2019-11-09 NOTE — Telephone Encounter (Signed)
Attempted TOC call.  Left message for pt to contact the office.  

## 2019-11-11 LAB — TYPE AND SCREEN
ABO/RH(D): A NEG
Antibody Screen: POSITIVE
Unit division: 0
Unit division: 0

## 2019-11-11 LAB — BPAM RBC
Blood Product Expiration Date: 202105082359
Blood Product Expiration Date: 202105082359
Unit Type and Rh: 600
Unit Type and Rh: 600

## 2019-11-12 NOTE — Progress Notes (Signed)
Cardiology Office Note:    Date:  11/13/2019   ID:  JOURI THREAT, DOB 12-28-1931, MRN [id number]  PCP:  Renaldo Reel, PA  Cardiologist:  Shirlee More, MD    Referring MD: Renaldo Reel, PA    ASSESSMENT:    1. S/P TAVR (transcatheter aortic valve replacement)   2. Hypertensive heart disease without heart failure   3. Dyslipidemia   4. Stage 2 chronic kidney disease   5. Anemia, unspecified type    PLAN: -   In order of problems listed above:  1. No indication of valve dysfunction I do think that she has heart failure without fluid overload we will put her on a small dose of loop diuretic stop her thiazide check labs today renal function with increasing doses of hydrochlorothiazide and risk of hypokalemia proBNP level and a CBC with significant anemia she is taking iron.  I reassured her that she will improve 2. Continue her statin 3. Recheck renal function 4. Continue iron check CBC today   Next appointment: 1 month   Medication Adjustments/Labs and Tests Ordered: Current medicines are reviewed at length with the patient today.  Concerns regarding medicines are outlined above.  No orders of the defined types were placed in this encounter.  No orders of the defined types were placed in this encounter.   Chief Complaint  Patient presents with  . Hospitalization Follow-up    History of Present Illness:    YASLYN CUMBY is a 86 y.o. female with a hx of aortic stenosis, hypertension, CKD  and hyperlipidemia  She was last seen 10/02/2019 and with symptomatic and severe aortic stenosis referred for TAVR.Marland Kitchen Compliance with diet, lifestyle and medications: Yes  She returned 1 week ago and was encouraged to see me quickly after discharge.  She remains intermittently short of breath she had high filling pressures in my opinion has heart failure and although no fluid overload we will switch her from thiazide to small dose of loop diuretic.  She is feeling better she has some  bruising in her groin but no chest pain orthopnea edema palpitation or syncope.  She discussed going to the dentist selectively not talking delay at least 30 days after TAVR to allow for endothelial healing.  She will follow endocarditis prophylaxis with amoxicillin  She underwent TAVR 11/06/2019 with an [place] 3 TAVR at [facility].  She also had mitral stenosis and a small pulmonary nodule with recommendations for follow-up CT in 6 to 12 months subtle low-attenuation lesion in the head of pancreas with recommendations for follow-up neurology in several months.  Globin was stable in the range of 9.5 and renal function remained stable throughout hospitalization.. I independently reviewed coronary angiography from 10/15/2019 and agree that she has very mild nonobstructive CAD and severe aortic stenosis.  Left echocardiogram 11/07/2019 shows normal functioning TAVR, I independently reviewed the echocardiogram done by the structural heart team [facility]. Past Medical History:  Diagnosis Date  . Acute on chronic diastolic heart failure ([place]) 11/06/2019  . Arthritis   . CKD (chronic kidney disease), stage III   . Dyslipidemia 03/08/2017   Statin intolerance  . Dyspnea   . GERD (gastroesophageal reflux disease) 03/08/2017  . Hemoptysis 04/04/2018  . Hypertensive heart disease 03/08/2017  . Malignant neoplasm of rectum ([place]) 03/11/2017  . Mitral stenosis   . Pre-diabetes   . Prediabetes 03/08/2017  . Pure hypercholesterolemia 04/08/2015   Overview:  Intolerant of pravastatin and simvastatin  .  Rectal cancer ([place])   . S/P TAVR (transcatheter aortic valve replacement) 11/06/2019   s/p TAVR w/ a 23 mm Edwards S3U via the TF approach by Dr. Angelena Form & Cyndia Bent .  Marland Kitchen Severe aortic stenosis   . Vitamin D deficiency 03/08/2017    Past Surgical History:  Procedure Laterality Date  . APPENDECTOMY    . BLADDER SURGERY    . BREAST BIOPSY    . CESAREAN SECTION    . CHOLECYSTECTOMY    .  COLON SURGERY    . EUS N/A 02/24/2017   Procedure: LOWER ENDOSCOPIC ULTRASOUND (EUS);  Surgeon: Milus Banister, MD;  Location: [facility];  Service: Endoscopy;  Laterality: N/A;  . EYE SURGERY    . RIGHT/LEFT HEART CATH AND CORONARY ANGIOGRAPHY N/A 10/15/2019   Procedure: RIGHT/LEFT HEART CATH AND CORONARY ANGIOGRAPHY;  Surgeon: Burnell Blanks, MD;  Location: [facility];  Service: Cardiovascular;  Laterality: N/A;  . TEE WITHOUT CARDIOVERSION N/A 11/06/2019   Procedure: TRANSESOPHAGEAL ECHOCARDIOGRAM (TEE);  Surgeon: Burnell Blanks, MD;  Location: [facility];  Service: Open Heart Surgery;  Laterality: N/A;  . TONSILLECTOMY    . TRANSCATHETER AORTIC VALVE REPLACEMENT, TRANSFEMORAL N/A 11/06/2019   Procedure: TRANSCATHETER AORTIC VALVE REPLACEMENT, TRANSFEMORAL;  Surgeon: Burnell Blanks, MD;  Location: [facility];  Service: Open Heart Surgery;  Laterality: N/A;    Current Medications: Current Meds  Medication Sig  . acetaminophen (TYLENOL) 500 MG tablet Take 500 mg by mouth as needed.  Marland Kitchen aluminum hydroxide-magnesium carbonate (GAVISCON) 95-358 MG/15ML SUSP Take 30 mLs by mouth at bedtime. Hiatal hernia  . amLODipine (NORVASC) 10 MG tablet Take 10 mg by mouth daily.   Marland Kitchen aspirin EC 81 MG tablet Take 81 mg by mouth daily.  . benazepril (LOTENSIN) 40 MG tablet Take 40 mg by mouth daily.  . Calcium Carb-Cholecalciferol (CALCIUM 600+D3 PO) Take 1 tablet by mouth daily.  . calcium carbonate (TUMS EX) 750 MG chewable tablet Chew 1 tablet by mouth 4 (four) times daily as needed for heartburn.   . Cholecalciferol (VITAMIN D-3) 125 MCG (5000 UT) TABS Take 5,000 Units by mouth daily.  . clopidogrel (PLAVIX) 75 MG tablet Take 1 tablet (75 mg total) by mouth daily with breakfast.  . Coenzyme Q10 (COQ10) 100 MG CAPS Take 100 mg by mouth at bedtime.  Marland Kitchen ezetimibe (ZETIA) 10 MG tablet Take 10 mg by mouth every evening.  . fenofibrate (TRICOR) 145 MG tablet  Take 145 mg by mouth every Tuesday, Thursday, Saturday, and Sunday at 6 PM.   . ferrous gluconate (IRON 27) 240 (27 FE) MG tablet Take 27 mg by mouth daily.  . hydrochlorothiazide (MICROZIDE) 12.5 MG capsule Take 12.5 mg by mouth daily.  . montelukast (SINGULAIR) 10 MG tablet Take 10 mg by mouth daily as needed (sinus drainage).   . Multiple Vitamin (MULTIVITAMIN WITH MINERALS) TABS tablet Take 1 tablet by mouth daily.  Marland Kitchen nystatin cream (MYCOSTATIN) Apply 1 application topically 2 (two) times daily as needed (irritation).  Marland Kitchen omeprazole (PRILOSEC) 20 MG capsule Take 20 mg by mouth daily before breakfast.   . OVER THE COUNTER MEDICATION Take 1,500-3,000 mg by mouth See admin instructions. (D-mannose) Take 1 capsule (1500 mg) by mouth in the morning & take 2 capsules (3000 mg) by mouth at night.  . phenazopyridine (PYRIDIUM) 95 MG tablet Take 95 mg by mouth as needed for pain.  Marland Kitchen Potassium 99 MG TABS Take 99 mg by mouth daily.  Marland Kitchen  pravastatin (PRAVACHOL) 20 MG tablet Take 40 mg by mouth every Monday, Wednesday, and Friday. At night   . Probiotic Product (PROBIOTIC PO) Take 1 capsule by mouth daily.  Marland Kitchen tiZANidine (ZANAFLEX) 2 MG tablet Take 2 mg by mouth at bedtime as needed for muscle spasms.   . Turmeric 500 MG CAPS Take 500 mg by mouth daily.   . Wheat Dextrin (BENEFIBER DRINK MIX) PACK Take 1 Package by mouth every other day as needed (constipation.).      Allergies:   Claritin-d 12 hour [loratadine-pseudoephedrine er], Codeine, Lisinopril, and Statins   Social History   Socioeconomic History  . Marital status: Widowed    Spouse name: Not on file  . Number of children: Not on file  . Years of education: Not on file  . Highest education level: Not on file  Occupational History  . Not on file  Tobacco Use  . Smoking status: Never Smoker  . Smokeless tobacco: Never Used  Substance and Sexual Activity  . Alcohol use: No  . Drug use: No  . Sexual activity: Not on file  Other Topics  Concern  . Not on file  Social History Narrative  . Not on file   Social Determinants of Health   Financial Resource Strain:   . Difficulty of Paying Living Expenses:   Food Insecurity:   . Worried About Charity fundraiser in the Last Year:   . Arboriculturist in the Last Year:   Transportation Needs:   . Film/video editor (Medical):   Marland Kitchen Lack of Transportation (Non-Medical):   Physical Activity:   . Days of Exercise per Week:   . Minutes of Exercise per Session:   Stress:   . Feeling of Stress :   Social Connections:   . Frequency of Communication with Friends and Family:   . Frequency of Social Gatherings with Friends and Family:   . Attends Religious Services:   . Active Member of Clubs or Organizations:   . Attends Archivist Meetings:   Marland Kitchen Marital Status:      Family History: The patient's family history includes AAA (abdominal aortic aneurysm) in her brother; Congestive Heart Failure in her father; Heart attack in her mother; Heart disease in her sister; Parkinson's disease in her sister. ROS:   Please see the history of present illness.    All other systems reviewed and are negative.  EKGs/Labs/Other Studies Reviewed:    The following studies were reviewed today:   Recent Labs: 11/02/2019: ALT 19; B Natriuretic Peptide 297.2 11/07/2019: Magnesium 2.1 11/08/2019: BUN 17; Creatinine, Ser 0.90; Hemoglobin 9.5; Platelets 168; Potassium 3.6; Sodium 136  Recent Lipid Panel No results found for: CHOL, TRIG, HDL, CHOLHDL, VLDL, LDLCALC, LDLDIRECT  Physical Exam:    VS:  BP (!) 150/64 (BP Location: Left Arm, Patient Position: Sitting, Cuff Size: Normal)   Pulse (!) 102   Ht 4\' 11"  (1.499 m)   Wt 151 lb (68.5 kg)   SpO2 97%   BMI 30.50 kg/m     Wt Readings from Last 3 Encounters:  11/13/19 151 lb (68.5 kg)  11/08/19 153 lb 12.8 oz (69.8 kg)  11/02/19 154 lb (69.9 kg)     GEN:  Well nourished, well developed in no acute distress HEENT:  Normal NECK: No JVD; No carotid bruits LYMPHATICS: No lymphadenopathy CARDIAC: RRR, no murmurs, rubs, gallops RESPIRATORY:  Clear to auscultation without rales, wheezing or rhonchi  ABDOMEN: Soft, non-tender, non-distended MUSCULOSKELETAL:  No edema; No deformity  SKIN: Warm and dry NEUROLOGIC:  Alert and oriented x 3 PSYCHIATRIC:  Normal affect    Signed, Shirlee More, MD  11/13/2019 1:28 PM    [facility]

## 2019-11-13 ENCOUNTER — Ambulatory Visit: Payer: Medicare Other | Admitting: Cardiology

## 2019-11-13 ENCOUNTER — Other Ambulatory Visit: Payer: Self-pay

## 2019-11-13 VITALS — BP 150/64 | HR 102 | Ht 59.0 in | Wt 151.0 lb

## 2019-11-13 DIAGNOSIS — I119 Hypertensive heart disease without heart failure: Secondary | ICD-10-CM

## 2019-11-13 DIAGNOSIS — E785 Hyperlipidemia, unspecified: Secondary | ICD-10-CM

## 2019-11-13 DIAGNOSIS — I5033 Acute on chronic diastolic (congestive) heart failure: Secondary | ICD-10-CM | POA: Diagnosis not present

## 2019-11-13 DIAGNOSIS — Z952 Presence of prosthetic heart valve: Secondary | ICD-10-CM

## 2019-11-13 DIAGNOSIS — N182 Chronic kidney disease, stage 2 (mild): Secondary | ICD-10-CM

## 2019-11-13 DIAGNOSIS — D649 Anemia, unspecified: Secondary | ICD-10-CM

## 2019-11-13 DIAGNOSIS — R0602 Shortness of breath: Secondary | ICD-10-CM | POA: Diagnosis not present

## 2019-11-13 MED ORDER — FUROSEMIDE 20 MG PO TABS: 20.0000 mg | ORAL_TABLET | ORAL | 3 refills | Status: DC

## 2019-11-13 MED ORDER — AMOXICILLIN 500 MG PO CAPS: ORAL_CAPSULE | ORAL | 3 refills | Status: AC

## 2019-11-13 NOTE — Patient Instructions (Signed)
Medication Instructions:  Your physician has recommended you make the following change in your medication:  DISCONTINUE: HCTZ START: Furosemide 20 mg take one tablet by mouth every other day.  START: Amoxicillin 500 mg. Take 4 tablets by mouth one hour prior to your dental procedure.  *If you need a refill on your cardiac medications before your next appointment, please call your pharmacy*   Lab Work: Your physician recommends that you return for lab work in: TODAY CBC, BMP, ProBNP If you have labs (blood work) drawn today and your tests are completely normal, you will receive your results only by: Marland Kitchen MyChart Message (if you have MyChart) OR . A paper copy in the mail If you have any lab test that is abnormal or we need to change your treatment, we will call you to review the results.   Testing/Procedures: None   Follow-Up: At [facility], you and your health needs are our priority.  As part of our continuing mission to provide you with exceptional heart care, we have created designated Provider Care Teams.  These Care Teams include your primary Cardiologist (physician) and Advanced Practice Providers (APPs -  Physician Assistants and Nurse Practitioners) who all work together to provide you with the care you need, when you need it.  We recommend signing up for the patient portal called "MyChart".  Sign up information is provided on this After Visit Summary.  MyChart is used to connect with patients for Virtual Visits (Telemedicine).  Patients are able to view lab/test results, encounter notes, upcoming appointments, etc.  Non-urgent messages can be sent to your provider as well.   To learn more about what you can do with MyChart, go to [email or web address].    Your next appointment:   4 week(s)  The format for your next appointment:   In Person  Provider:   Shirlee More, MD   Other Instructions

## 2019-11-13 NOTE — Addendum Note (Signed)
Addended by: Resa Miner I on: 11/13/2019 01:36 PM   Modules accepted: Orders

## 2019-11-14 ENCOUNTER — Other Ambulatory Visit: Payer: Self-pay | Admitting: *Deleted

## 2019-11-14 LAB — BASIC METABOLIC PANEL
BUN/Creatinine Ratio: 20 (ref 12–28)
BUN: 21 mg/dL (ref 8–27)
CO2: 25 mmol/L (ref 20–29)
Calcium: 9.8 mg/dL (ref 8.7–10.3)
Chloride: 96 mmol/L (ref 96–106)
Creatinine, Ser: 1.04 mg/dL — ABNORMAL HIGH (ref 0.57–1.00)
GFR calc Af Amer: 57 mL/min/{1.73_m2} — ABNORMAL LOW (ref >59)
GFR calc non Af Amer: 49 mL/min/{1.73_m2} — ABNORMAL LOW (ref >59)
Glucose: 108 mg/dL — ABNORMAL HIGH (ref 65–99)
Potassium: 4.4 mmol/L (ref 3.5–5.2)
Sodium: 135 mmol/L (ref 134–144)

## 2019-11-14 LAB — CBC
Hematocrit: 36.8 % (ref 34.0–46.6)
Hemoglobin: 12 g/dL (ref 11.1–15.9)
MCH: 27.8 pg (ref 26.6–33.0)
MCHC: 32.6 g/dL (ref 31.5–35.7)
MCV: 85 fL (ref 79–97)
Platelets: 251 10*3/uL (ref 150–450)
RBC: 4.32 x10E6/uL (ref 3.77–5.28)
RDW: 13.1 % (ref 11.7–15.4)
WBC: 8.6 10*3/uL (ref 3.4–10.8)

## 2019-11-14 LAB — PRO B NATRIURETIC PEPTIDE: NT-Pro BNP: 874 pg/mL — ABNORMAL HIGH (ref 0–738)

## 2019-11-14 NOTE — Patient Outreach (Signed)
[facility]) Care Management  11/14/2019  Hannah Briggs Jun 18, 1932 [id number]   RED ON EMMI ALERT - General Discharge Day # 4 Date: 4/27 Red Alert Reason: Questions about discharge and no follow up   Outreach attempt #1, successful, identity verified.  This care manager introduced self and stated purpose of call.  [facility] care management services explained.  Member report she is doing better, breathing has improved since discharge but can tell she is still recovering.  She lives alone and is independent but still has "lots" of family and friends that are available to help when needed.  Was seen by the cardiologist yesterday, confirms she has started new medications.  Will have follow up on 6/1 as well as 6/8.  She will be able to drive herself to these appointments.  Denies any further questions regarding discharge but agrees to contact this care manager or MD with concerns.   Plan: RN CM will send successful outreach letter and close case as no further needs identified.  Valente David, [place], MSN [place] [phone]

## 2019-11-20 DIAGNOSIS — R339 Retention of urine, unspecified: Secondary | ICD-10-CM | POA: Diagnosis not present

## 2019-11-20 DIAGNOSIS — N390 Urinary tract infection, site not specified: Secondary | ICD-10-CM | POA: Diagnosis not present

## 2019-11-23 ENCOUNTER — Telehealth: Payer: Self-pay | Admitting: Cardiology

## 2019-11-23 MED ORDER — FUROSEMIDE 20 MG PO TABS: 20.0000 mg | ORAL_TABLET | Freq: Every day | ORAL | 3 refills | Status: DC

## 2019-11-23 NOTE — Telephone Encounter (Signed)
Pt c/o medication issue:  1. Name of Medication: furosemide (LASIX) 20 MG tablet  2. How are you currently taking this medication (dosage and times per day)? As directed  3. Are you having a reaction (difficulty breathing--STAT)? no  4. What is your medication issue? Patient states that this medication does not work as good as the hydrochlorothiazide. She states that on the days she does not take the furosemide because it is every other day, her feet still swell up. Please advise.

## 2019-11-23 NOTE — Telephone Encounter (Signed)
Spoke with patient regarding recommendations.  Patient verbalizes understanding and is agreeable to plan of care. Advised patient to call back with any issues or concerns.

## 2019-11-23 NOTE — Telephone Encounter (Signed)
I know it is not what she wants to hear but I think she needs to take the furosemide daily and I think she transiently she is having mild heart failure and to her body adjust to the valve replacement.  Next visit to the office I am open to discussion of transition back to hydrochlorothiazide

## 2019-12-12 ENCOUNTER — Ambulatory Visit: Payer: Medicare Other | Admitting: Cardiology

## 2019-12-13 ENCOUNTER — Ambulatory Visit: Payer: Medicare Other | Admitting: Physician Assistant

## 2019-12-13 ENCOUNTER — Other Ambulatory Visit (HOSPITAL_COMMUNITY): Payer: Medicare Other

## 2019-12-14 ENCOUNTER — Other Ambulatory Visit: Payer: Self-pay | Admitting: Physician Assistant

## 2019-12-14 DIAGNOSIS — K862 Cyst of pancreas: Secondary | ICD-10-CM

## 2019-12-17 NOTE — Progress Notes (Addendum)
Cardiology Office Note:    Date:  12/18/2019   ID:  Luetta Nutting, DOB October 31, 1931, MRN [id number]  PCP:  Renaldo Reel, PA  Cardiologist:  Shirlee More, MD   Electrophysiologist:  None  [facility]:  Lauree Chandler, MD     Referring MD: Renaldo Reel, [place]   Chief Complaint:  Hospitalization Follow-up (S/p TAVR)    Patient Profile:    Hannah Briggs is a 86 y.o. female with:   Aortic stenosis  S/p TAVR 10/2019  Limited echocardiogram 10/2019: mean 8.3 mmHg, no PVL  Diastolic CHF  Hyperlipidemia   GERD   Chronic kidney disease stage 3  Borderline Diabetes mellitus   Mod-severe mitral stenosis   Echocardiogram 10/2019: mean 13.1 mmHg  Coronary artery disease   Mild non-obs by cath 09/2019  Chronic anemia   Pulmonary nodules  Pancreatic mass  Prior CV studies: Limited echocardiogram 11/07/19 EF 70-75, mod conc LVH, normal RVSF, mild LAE, mod to severe MS (mean 13.1), normal TAVR w mean 8.3 and no PVL  Carotid US 10/18/19 Bilat 1-39  Abd/Pelvic CTA 10/18/19 IMPRESSION: 1. Vascular findings and measurements pertinent to potential TAVR procedure, as detailed above. 2. Thickening calcification of the aortic valve, compatible with the reported clinical history of severe aortic stenosis. 3. Severe calcifications also noted of the mitral annulus and mitral valve. 4. Aortic atherosclerosis, in addition to left main and 2 vessel coronary artery disease. 5. Small pulmonary nodules in the lungs, largest of which is a ground-glass attenuation nodule in the left upper lobe (1.6 x 1.0 cm). Initial follow-up with CT at 6-12 months is recommended to confirm persistence. If persistent, repeat CT is recommended every 2 years until 5 years of stability has been established. This recommendation follows the consensus statement: Guidelines for Management of Incidental Pulmonary Nodules Detected on CT Images: From the Fleischner Society 2017; Radiology 2017;  284:228-243. 6. The appearance of the lungs suggests mild interstitial pulmonary edema. Mild cardiomegaly. Imaging findings suggestive of mild congestive heart failure. 7. 1.3 x 0.9 x 1.2 cm low-attenuation lesion in the head of the pancreas. This is nonspecific, and may simply represent a tiny pancreatic pseudocyst. However, attention on follow-up studies is recommended. Follow-up abdominal MRI with and without IV gadolinium with MRCP or pancreatic protocol CT scan is recommended in 2 months to ensure the stability of this finding. This recommendation follows ACR consensus guidelines: Management of Incidental Pancreatic Cysts: A White Paper of the ACR Incidental Findings Committee. J Am Coll Radiol 7353;29:924-268. 8. Severe colonic diverticulosis without evidence of acute diverticulitis at this time. 9. Additional incidental findings, as above.   Cardiac catheterization 10/15/19 LM ost 10 LAD mid 20 LCx prox 10 RCA prox 10   History of Present Illness:    Hannah Briggs was admitted 4/20-4/22 and underwent TAVR via R transfemoral approach by Dr. Angelena Form and Dr. Cyndia Bent.  Her post op echocardiogram showed a normally functioning TAVR with no paravalvular leak.  She had a transient LBBB but no high grade heart block.  She remained in the hospital an additional day due to nausea.    Of note, her CT scan showed small pulmonary nodules.  She will need repeat CT in 6-12 mos.  Also, there was a non-specific lesion in the head of the pancreas.  FU abd MRI was recommended in 2 mos.    She saw her primary cardiologist, Dr. Bettina Gavia, 11/13/2019.  Her HCTZ was stopped and she was placed on furosemide.  She  returns for post hospital follow up.  She is here alone.  Since discharge in the hospital, she has been doing well.  She went to the beach with her family this past week and had a great time.  She is able to ambulate without significant shortness of breath.  She does still get short of breath with  some activities.  She has not had chest discomfort, syncope, orthopnea or significant leg swelling.     Past Medical History:  Diagnosis Date  . Acute on chronic diastolic heart failure ([place]) 11/06/2019  . Arthritis   . CKD (chronic kidney disease), stage III   . Dyslipidemia 03/08/2017   Statin intolerance  . Dyspnea   . GERD (gastroesophageal reflux disease) 03/08/2017  . Hemoptysis 04/04/2018  . Hypertensive heart disease 03/08/2017  . Malignant neoplasm of rectum ([place]) 03/11/2017  . Mitral stenosis   . Pre-diabetes   . Prediabetes 03/08/2017  . Pure hypercholesterolemia 04/08/2015   Overview:  Intolerant of pravastatin and simvastatin  . Rectal cancer ([place])   . S/P TAVR (transcatheter aortic valve replacement) 11/06/2019   s/p TAVR w/ a 23 mm Edwards S3U via the TF approach by Dr. Angelena Form & Cyndia Bent .  Marland Kitchen Severe aortic stenosis   . Vitamin D deficiency 03/08/2017    Current Medications: Current Meds  Medication Sig  . acetaminophen (TYLENOL) 500 MG tablet Take 500 mg by mouth as needed.  Marland Kitchen aluminum hydroxide-magnesium carbonate (GAVISCON) 95-358 MG/15ML SUSP Take 30 mLs by mouth at bedtime. Hiatal hernia  . amLODipine (NORVASC) 10 MG tablet Take 10 mg by mouth daily.   Marland Kitchen amoxicillin (AMOXIL) 500 MG capsule Take 2gm (4 tablets) one hour prior to your dental procedures.  Marland Kitchen aspirin EC 81 MG tablet Take 81 mg by mouth daily.  . benazepril (LOTENSIN) 40 MG tablet Take 40 mg by mouth daily.  . Calcium Carb-Cholecalciferol (CALCIUM 600+D3 PO) Take 1 tablet by mouth daily.  . calcium carbonate (TUMS EX) 750 MG chewable tablet Chew 1 tablet by mouth 4 (four) times daily as needed for heartburn.   . Cetirizine HCl (ZYRTEC ALLERGY PO) Take by mouth.  . Cholecalciferol (VITAMIN D-3) 125 MCG (5000 UT) TABS Take 5,000 Units by mouth daily.  . clopidogrel (PLAVIX) 75 MG tablet Take 1 tablet (75 mg total) by mouth daily with breakfast.  . Coenzyme Q10 (COQ10) 100 MG CAPS Take 100 mg by mouth at  bedtime.  Marland Kitchen ezetimibe (ZETIA) 10 MG tablet Take 10 mg by mouth every evening.  . fenofibrate (TRICOR) 145 MG tablet Take 145 mg by mouth every Tuesday, Thursday, Saturday, and Sunday at 6 PM.   . ferrous gluconate (IRON 27) 240 (27 FE) MG tablet Take 27 mg by mouth daily.  . furosemide (LASIX) 20 MG tablet Take 1 tablet (20 mg total) by mouth daily.  . Multiple Vitamin (MULTIVITAMIN WITH MINERALS) TABS tablet Take 1 tablet by mouth daily.  Marland Kitchen nystatin cream (MYCOSTATIN) Apply 1 application topically 2 (two) times daily as needed (irritation).  Marland Kitchen omeprazole (PRILOSEC) 20 MG capsule Take 20 mg by mouth daily before breakfast.   . OVER THE COUNTER MEDICATION Take 1,500-3,000 mg by mouth See admin instructions. (D-mannose) Take 1 capsule (1500 mg) by mouth in the morning & take 2 capsules (3000 mg) by mouth at night.  . phenazopyridine (PYRIDIUM) 95 MG tablet Take 95 mg by mouth as needed for pain.  Marland Kitchen Potassium 99 MG TABS Take 99 mg by mouth daily.  . pravastatin (PRAVACHOL) 20  MG tablet Take 40 mg by mouth every Monday, Wednesday, and Friday. At night   . Probiotic Product (PROBIOTIC PO) Take 1 capsule by mouth daily.  Marland Kitchen tiZANidine (ZANAFLEX) 2 MG tablet Take 2 mg by mouth at bedtime as needed for muscle spasms.   . Turmeric 500 MG CAPS Take 500 mg by mouth daily.   . Wheat Dextrin (BENEFIBER DRINK MIX) PACK Take 1 Package by mouth every other day as needed (constipation.).   . [DISCONTINUED] montelukast (SINGULAIR) 10 MG tablet Take 10 mg by mouth daily as needed (sinus drainage).      Allergies:   Claritin-d 12 hour [loratadine-pseudoephedrine er], Codeine, Lisinopril, and Statins   Social History   Tobacco Use  . Smoking status: Never Smoker  . Smokeless tobacco: Never Used  Substance Use Topics  . Alcohol use: No  . Drug use: No     Family Hx: The patient's family history includes AAA (abdominal aortic aneurysm) in her brother; Congestive Heart Failure in her father; Heart attack in  her mother; Heart disease in her sister; Parkinson's disease in her sister.  ROS   EKGs/Labs/Other Test Reviewed:    EKG:  EKG is not ordered today.  The ekg ordered today demonstrates n/a  Recent Labs: 11/02/2019: ALT 19; B Natriuretic Peptide 297.2 11/07/2019: Magnesium 2.1 11/13/2019: BUN 21; Creatinine, Ser 1.04; Hemoglobin 12.0; NT-Pro BNP 874; Platelets 251; Potassium 4.4; Sodium 135   Recent Lipid Panel No results found for: CHOL, TRIG, HDL, CHOLHDL, LDLCALC, LDLDIRECT  Physical Exam:    VS:  BP 128/66   Pulse (!) 103   Ht 4\' 11"  (1.499 m)   Wt 153 lb 1.9 oz (69.5 kg)   SpO2 97%   BMI 30.93 kg/m     Wt Readings from Last 3 Encounters:  12/18/19 153 lb 1.9 oz (69.5 kg)  11/13/19 151 lb (68.5 kg)  11/08/19 153 lb 12.8 oz (69.8 kg)     Constitutional:      Appearance: Healthy appearance. Not in distress.  Neck:     Thyroid: No thyromegaly.     Vascular: JVD normal.  Pulmonary:     Effort: Pulmonary effort is normal.     Breath sounds: No wheezing. No rales.  Cardiovascular:     Tachycardia present. Regular rhythm. Normal S1. Normal S2.     Murmurs: There is no murmur.  Edema:    Peripheral edema absent.  Abdominal:     Palpations: Abdomen is soft. There is no hepatomegaly.  Skin:    General: Skin is warm and dry.  Neurological:     Mental Status: Alert and oriented to person, place and time.     Cranial Nerves: Cranial nerves are intact.      ASSESSMENT & PLAN:    1. Severe aortic stenosis 2. S/P TAVR (transcatheter aortic valve replacement) She is doing well post TAVR.  She had her follow-up echocardiogram earlier today.  The results are still pending.  Her exam is not consistent with paravalvular leak.  Continue aspirin, clopidogrel.  Continue SBE prophylaxis. [place] Cardiomyopathy Questionnaire  KCCQ-12 12/18/2019 10/25/2019  1 a. Ability to shower/bathe Not at all limited Not at all limited  1 b. Ability to walk 1 block Not at all limited  Moderately limited  1 c. Ability to hurry/jog Slightly limited Quite a bit limited  2. Edema feet/ankles/legs Never over the past 2 weeks Never over the past 2 weeks  3. Limited by fatigue 1-2 times a week At least  once a day  4. Limited by dyspnea Never over the past 2 weeks 1-2 times a week  5. Sitting up / on 3+ pillows Never over the past 2 weeks Never over the past 2 weeks  6. Limited enjoyment of life Not limited at all Moderately limited  7. Rest of life w/ symptoms Mostly satisfied Somewhat satisfied  8 a. Participation in hobbies Slightly limited N/A, did not do for other reasons  8 b. Participation in chores Did not limit at all Moderately limited  8 c. Visiting family/friends Did not limit at all N/A, did not do for other reasons     3. Chronic diastolic CHF (congestive heart failure) (HCC) NYHA IIb.  Volume status stable.  Continue current therapy.  4. Nonrheumatic mitral valve stenosis Moderate to severe by most recent echocardiogram.  Repeat echocardiogram still pending as outlined above.  5. Stage 3a chronic kidney disease Most recent creatinine stable.  6. Coronary artery disease involving native coronary artery of native heart without angina pectoris Mild nonobstructive disease by recent cardiac catheterization.  She is doing well without anginal symptoms.  Continue statin therapy.  7. Pulmonary nodules I have reached out to her oncologist to determine if these nodules are new or old.  If they are new, we will refer her to the pulmonary nodule clinic.  8. Pancreatic lesion As noted, I have reached out to her oncologist.  If this lesion is new, we will proceed with her pancreatic protocol CT as planned.   ADDENDUM 01/03/20 @2 :34pm: pt's oncologist got back to [place] and said the pulmonary nodules have been there and are stable and require no further follow up. Pancreas CT has been scheduled for 6/24 pending lab work on Monday 6/2/1. If creat returns to baseline, we will  proceed with CT scan. - Nell Range PA-C  Dispo:  Return in 1 week (on 12/25/2019) for Scheduled Follow Up with Dr. Bettina Gavia.   Medication Adjustments/Labs and Tests Ordered: Current medicines are reviewed at length with the patient today.  Concerns regarding medicines are outlined above.  Tests Ordered: No orders of the defined types were placed in this encounter.  Medication Changes: No orders of the defined types were placed in this encounter.   Signed, Richardson Dopp, PA-C  12/18/2019 5:33 PM    [facility] [address], [place], [place]  [address] Phone: [phone]; Fax: [phone]   ADDENDUM: I spoke with the patient's oncologist, Dr. Hinton Rao.  The pulmonary nodules noted on her CT prior to TAVR appear to be old.  She does not need a referral to the pulmonary nodule clinic. The pancreatic lesion appears new.  Therefore, she will need the pancreatic CT arranged to better evaluate.  Dr. Hinton Rao sees her later this summer, but can see her sooner if there is any issue with the pancreatic findings.   Richardson Dopp, PA-C    12/21/2019 1:37 PM

## 2019-12-18 ENCOUNTER — Ambulatory Visit: Payer: Medicare Other | Admitting: Physician Assistant

## 2019-12-18 ENCOUNTER — Ambulatory Visit (HOSPITAL_COMMUNITY): Payer: Medicare Other | Attending: Cardiology

## 2019-12-18 ENCOUNTER — Encounter: Payer: Self-pay | Admitting: Physician Assistant

## 2019-12-18 ENCOUNTER — Other Ambulatory Visit: Payer: Self-pay | Admitting: Physician Assistant

## 2019-12-18 ENCOUNTER — Other Ambulatory Visit: Payer: Self-pay

## 2019-12-18 VITALS — BP 128/66 | HR 103 | Ht 59.0 in | Wt 153.1 lb

## 2019-12-18 DIAGNOSIS — I342 Nonrheumatic mitral (valve) stenosis: Secondary | ICD-10-CM | POA: Diagnosis not present

## 2019-12-18 DIAGNOSIS — I2510 Atherosclerotic heart disease of native coronary artery without angina pectoris: Secondary | ICD-10-CM

## 2019-12-18 DIAGNOSIS — I350 Nonrheumatic aortic (valve) stenosis: Secondary | ICD-10-CM

## 2019-12-18 DIAGNOSIS — Z952 Presence of prosthetic heart valve: Secondary | ICD-10-CM

## 2019-12-18 DIAGNOSIS — I5032 Chronic diastolic (congestive) heart failure: Secondary | ICD-10-CM

## 2019-12-18 DIAGNOSIS — K869 Disease of pancreas, unspecified: Secondary | ICD-10-CM

## 2019-12-18 DIAGNOSIS — R918 Other nonspecific abnormal finding of lung field: Secondary | ICD-10-CM

## 2019-12-18 DIAGNOSIS — N1831 Chronic kidney disease, stage 3a: Secondary | ICD-10-CM | POA: Diagnosis not present

## 2019-12-18 NOTE — Patient Instructions (Signed)
Medication Instructions:  Your physician recommends that you continue on your current medications as directed. Please refer to the Current Medication list given to you today.  *If you need a refill on your cardiac medications before your next appointment, please call your pharmacy*   Lab Work: NONE If you have labs (blood work) drawn today and your tests are completely normal, you will receive your results only by: Marland Kitchen MyChart Message (if you have MyChart) OR . A paper copy in the mail If you have any lab test that is abnormal or we need to change your treatment, we will call you to review the results.   Testing/Procedures: NONE   Follow-Up: At [facility], you and your health needs are our priority.  As part of our continuing mission to provide you with exceptional heart care, we have created designated Provider Care Teams.  These Care Teams include your primary Cardiologist (physician) and Advanced Practice Providers (APPs -  Physician Assistants and Nurse Practitioners) who all work together to provide you with the care you need, when you need it.  We recommend signing up for the patient portal called "MyChart".  Sign up information is provided on this After Visit Summary.  MyChart is used to connect with patients for Virtual Visits (Telemedicine).  Patients are able to view lab/test results, encounter notes, upcoming appointments, etc.  Non-urgent messages can be sent to your provider as well.   To learn more about what you can do with MyChart, go to [email or web address].    Your next appointment:   12/25/19  AT 2 PM  The format for your next appointment:   In Person  Provider:   Shirlee More, MD

## 2019-12-21 ENCOUNTER — Telehealth: Payer: Self-pay | Admitting: Physician Assistant

## 2019-12-21 ENCOUNTER — Other Ambulatory Visit: Payer: Self-pay

## 2019-12-21 DIAGNOSIS — Z01812 Encounter for preprocedural laboratory examination: Secondary | ICD-10-CM

## 2019-12-21 NOTE — Telephone Encounter (Signed)
Reviewed patient's CT with Dr. Hinton Rao. Pt does not need pulmonary nodule clinic referral. But, she does need a pancreatic CT.  See my OV note from earlier this week. Richardson Dopp, PA-C    12/21/2019 1:38 PM

## 2019-12-21 NOTE — Telephone Encounter (Signed)
Dr. Eloise Harman calling stating she is returning a call from [facility].

## 2019-12-24 NOTE — Progress Notes (Signed)
Cardiology Office Note:    Date:  12/25/2019   ID:  Hannah Briggs, DOB September 22, 1931, MRN [id number]  PCP:  Renaldo Reel, PA  Cardiologist:  Shirlee More, MD    Referring MD: Renaldo Reel, PA    ASSESSMENT:    1. S/P TAVR (transcatheter aortic valve replacement)   2. Chronic diastolic CHF (congestive heart failure) (HCC)   3. Stage 3a chronic kidney disease   4. Coronary artery disease involving native coronary artery of native heart without angina pectoris   5. Pure hypercholesterolemia    PLAN:    In order of problems listed above:  1. He has done well after TAVR is made a full recovery New York Heart Association class I continue antiplatelet therapy.  Her heart failure is compensated has no edema New York Heart Association class I continue her current loop diuretic and with upcoming CT scan check her renal function previous anemia check hemoglobin today.  Her CAD is stable she is on antiplatelet therapy aspirin clopidogrel and lipid-lowering treatment dyslipidemia stable her lipids are at target and she will continue her low intensity statin   Next appointment: 6 months   Medication Adjustments/Labs and Tests Ordered: Current medicines are reviewed at length with the patient today.  Concerns regarding medicines are outlined above.  No orders of the defined types were placed in this encounter.  No orders of the defined types were placed in this encounter.   Chief Complaint  Patient presents with  . Follow-up    After TAVR 11/08/2019  . Aortic Stenosis    History of Present Illness:    Hannah Briggs is a 86 y.o. female with a hx of aortic stenosis, hypertension, CKD  and hyperlipidemia last seen 11/13/2019 compensated heart failure initiate on a loop diuretic. Compliance with diet, lifestyle and medications: Yes  Is more tearful quite worried about the upcoming CT scan and I told her it is not uncommon to do the CT for one reason find something which is often  nonspecific and I told her if she had malignancy be found so early that her prognosis would be good.  She is enthusiastic her daughter is coming from [place] and the entire family is going to the beach in August and the focus of her life is to be with her family at that time.  She made a good recovery from TAVR and has no shortness of breath chest pain palpitation or syncope.  Recent labs 09/25/2019 shows a cholesterol 177 triglycerides 128 HDL 63 LDL 92 creatinine 1.4  She underwent TAVR 11/06/2019 with an [place] 3 TAVR at [facility]  She also had mitral stenosis and a small pulmonary nodule with recommendations for follow-up CT in 6 to 12 months subtle low-attenuation lesion in the head of pancreas with recommendations for follow-up neurology in several months.  Her hemoglobin was stable in the range of 9.5 and renal function remained stable throughout hospitalization.. I independently reviewed coronary angiography from 10/15/2019 and agree that she has very mild nonobstructive CAD and severe aortic stenosis.  Left echocardiogram 11/07/2019 shows normal functioning TAVR, I independently reviewed the echocardiogram done by the structural heart team [facility].   Past Medical History:  Diagnosis Date  . Acute on chronic diastolic heart failure ([place]) 11/06/2019  . Arthritis   . CKD (chronic kidney disease), stage III   . Dyslipidemia 03/08/2017   Statin intolerance  . Dyspnea   . GERD (gastroesophageal reflux disease) 03/08/2017  .  Hemoptysis 04/04/2018  . Hypertensive heart disease 03/08/2017  . Malignant neoplasm of rectum ([place]) 03/11/2017  . Mitral stenosis   . Pre-diabetes   . Prediabetes 03/08/2017  . Pure hypercholesterolemia 04/08/2015   Overview:  Intolerant of pravastatin and simvastatin  . Rectal cancer ([place])   . S/P TAVR (transcatheter aortic valve replacement) 11/06/2019   s/p TAVR w/ a 23 mm Edwards S3U via the TF approach by Dr. Angelena Form & Cyndia Bent .  Marland Kitchen  Severe aortic stenosis   . Vitamin D deficiency 03/08/2017    Past Surgical History:  Procedure Laterality Date  . APPENDECTOMY    . BLADDER SURGERY    . BREAST BIOPSY    . CESAREAN SECTION    . CHOLECYSTECTOMY    . COLON SURGERY    . EUS N/A 02/24/2017   Procedure: LOWER ENDOSCOPIC ULTRASOUND (EUS);  Surgeon: Milus Banister, MD;  Location: [facility];  Service: Endoscopy;  Laterality: N/A;  . EYE SURGERY    . RIGHT/LEFT HEART CATH AND CORONARY ANGIOGRAPHY N/A 10/15/2019   Procedure: RIGHT/LEFT HEART CATH AND CORONARY ANGIOGRAPHY;  Surgeon: Burnell Blanks, MD;  Location: [place] [facility];  Service: Cardiovascular;  Laterality: N/A;  . TEE WITHOUT CARDIOVERSION N/A 11/06/2019   Procedure: TRANSESOPHAGEAL ECHOCARDIOGRAM (TEE);  Surgeon: Burnell Blanks, MD;  Location: [place] [facility];  Service: Open Heart Surgery;  Laterality: N/A;  . TONSILLECTOMY    . TRANSCATHETER AORTIC VALVE REPLACEMENT, TRANSFEMORAL N/A 11/06/2019   Procedure: TRANSCATHETER AORTIC VALVE REPLACEMENT, TRANSFEMORAL;  Surgeon: Burnell Blanks, MD;  Location: [facility];  Service: Open Heart Surgery;  Laterality: N/A;    Current Medications: Current Meds  Medication Sig  . acetaminophen (TYLENOL) 500 MG tablet Take 500 mg by mouth as needed.  Marland Kitchen aluminum hydroxide-magnesium carbonate (GAVISCON) 95-358 MG/15ML SUSP Take 30 mLs by mouth at bedtime. Hiatal hernia  . amLODipine (NORVASC) 10 MG tablet Take 10 mg by mouth daily.   Marland Kitchen aspirin EC 81 MG tablet Take 81 mg by mouth daily.  . benazepril (LOTENSIN) 40 MG tablet Take 40 mg by mouth daily.  . Calcium Carb-Cholecalciferol (CALCIUM 600+D3 PO) Take 1 tablet by mouth daily.  . calcium carbonate (TUMS EX) 750 MG chewable tablet Chew 1 tablet by mouth 4 (four) times daily as needed for heartburn.   . Cetirizine HCl (ZYRTEC ALLERGY PO) Take by mouth.  . Cholecalciferol (VITAMIN D-3) 125 MCG (5000 UT) TABS Take 5,000 Units by mouth daily.   . clopidogrel (PLAVIX) 75 MG tablet Take 1 tablet (75 mg total) by mouth daily with breakfast.  . Coenzyme Q10 (COQ10) 100 MG CAPS Take 100 mg by mouth at bedtime.  Marland Kitchen ezetimibe (ZETIA) 10 MG tablet Take 10 mg by mouth every evening.  . fenofibrate (TRICOR) 145 MG tablet Take 145 mg by mouth every Tuesday, Thursday, Saturday, and Sunday at 6 PM.   . ferrous gluconate (IRON 27) 240 (27 FE) MG tablet Take 27 mg by mouth daily.  . furosemide (LASIX) 20 MG tablet Take 1 tablet (20 mg total) by mouth daily.  . Multiple Vitamin (MULTIVITAMIN WITH MINERALS) TABS tablet Take 1 tablet by mouth daily.  Marland Kitchen nystatin cream (MYCOSTATIN) Apply 1 application topically 2 (two) times daily as needed (irritation).  Marland Kitchen omeprazole (PRILOSEC) 20 MG capsule Take 20 mg by mouth daily before breakfast.   . OVER THE COUNTER MEDICATION Take 1,500-3,000 mg by mouth See admin instructions. (D-mannose) Take 1 capsule (1500 mg) by mouth in the  morning & take 2 capsules (3000 mg) by mouth at night.  Marland Kitchen OVER THE COUNTER MEDICATION D mannose powder 1 teaspoon a day  . phenazopyridine (PYRIDIUM) 95 MG tablet Take 95 mg by mouth as needed for pain.  Marland Kitchen Potassium 99 MG TABS Take 99 mg by mouth daily.  . pravastatin (PRAVACHOL) 20 MG tablet Take 40 mg by mouth every Monday, Wednesday, and Friday. At night   . Probiotic Product (PROBIOTIC PO) Take 1 capsule by mouth daily.  Marland Kitchen tiZANidine (ZANAFLEX) 2 MG tablet Take 2 mg by mouth at bedtime as needed for muscle spasms.   . Turmeric 500 MG CAPS Take 500 mg by mouth daily.   . Wheat Dextrin (BENEFIBER DRINK MIX) PACK Take 1 Package by mouth every other day as needed (constipation.).      Allergies:   Claritin-d 12 hour [loratadine-pseudoephedrine er], Codeine, Lisinopril, and Statins   Social History   Socioeconomic History  . Marital status: Widowed    Spouse name: Not on file  . Number of children: Not on file  . Years of education: Not on file  . Highest education level: Not on  file  Occupational History  . Not on file  Tobacco Use  . Smoking status: Never Smoker  . Smokeless tobacco: Never Used  Substance and Sexual Activity  . Alcohol use: No  . Drug use: No  . Sexual activity: Not on file  Other Topics Concern  . Not on file  Social History Narrative  . Not on file   Social Determinants of Health   Financial Resource Strain:   . Difficulty of Paying Living Expenses:   Food Insecurity:   . Worried About Charity fundraiser in the Last Year:   . Arboriculturist in the Last Year:   Transportation Needs:   . Film/video editor (Medical):   Marland Kitchen Lack of Transportation (Non-Medical):   Physical Activity:   . Days of Exercise per Week:   . Minutes of Exercise per Session:   Stress:   . Feeling of Stress :   Social Connections:   . Frequency of Communication with Friends and Family:   . Frequency of Social Gatherings with Friends and Family:   . Attends Religious Services:   . Active Member of Clubs or Organizations:   . Attends Archivist Meetings:   Marland Kitchen Marital Status:      Family History: The patient's family history includes AAA (abdominal aortic aneurysm) in her brother; Congestive Heart Failure in her father; Heart attack in her mother; Heart disease in her sister; Parkinson's disease in her sister. ROS:   Please see the history of present illness.    All other systems reviewed and are negative.  EKGs/Labs/Other Studies Reviewed:    The following studies were reviewed today:   Recent Labs: 11/02/2019: ALT 19; B Natriuretic Peptide 297.2 11/07/2019: Magnesium 2.1 11/13/2019: BUN 21; Creatinine, Ser 1.04; Hemoglobin 12.0; NT-Pro BNP 874; Platelets 251; Potassium 4.4; Sodium 135  Recent Lipid Panel No results found for: CHOL, TRIG, HDL, CHOLHDL, VLDL, LDLCALC, LDLDIRECT  Physical Exam:    VS:  BP 132/64   Pulse (!) 104   Ht 4\' 11"  (1.499 m)   Wt 152 lb 3.2 oz (69 kg)   SpO2 94%   BMI 30.74 kg/m     Wt Readings from Last  3 Encounters:  12/25/19 152 lb 3.2 oz (69 kg)  12/18/19 153 lb 1.9 oz (69.5 kg)  11/13/19 151 lb (  68.5 kg)     GEN:  Well nourished, well developed in no acute distress HEENT: Normal NECK: No JVD; No carotid bruits LYMPHATICS: No lymphadenopathy CARDIAC: No murmur RRR, no murmurs, rubs, gallops RESPIRATORY:  Clear to auscultation without rales, wheezing or rhonchi  ABDOMEN: Soft, non-tender, non-distended MUSCULOSKELETAL:  No edema; No deformity  SKIN: Warm and dry NEUROLOGIC:  Alert and oriented x 3 PSYCHIATRIC:  Normal affect    Signed, Shirlee More, MD  12/25/2019 2:18 PM    [facility]

## 2019-12-25 ENCOUNTER — Ambulatory Visit: Payer: Medicare Other | Admitting: Cardiology

## 2019-12-25 ENCOUNTER — Other Ambulatory Visit: Payer: Self-pay

## 2019-12-25 ENCOUNTER — Encounter: Payer: Self-pay | Admitting: Cardiology

## 2019-12-25 VITALS — BP 132/64 | HR 104 | Ht 59.0 in | Wt 152.2 lb

## 2019-12-25 DIAGNOSIS — I5032 Chronic diastolic (congestive) heart failure: Secondary | ICD-10-CM | POA: Diagnosis not present

## 2019-12-25 DIAGNOSIS — I2510 Atherosclerotic heart disease of native coronary artery without angina pectoris: Secondary | ICD-10-CM

## 2019-12-25 DIAGNOSIS — E7800 Pure hypercholesterolemia, unspecified: Secondary | ICD-10-CM | POA: Diagnosis not present

## 2019-12-25 DIAGNOSIS — N1831 Chronic kidney disease, stage 3a: Secondary | ICD-10-CM | POA: Diagnosis not present

## 2019-12-25 DIAGNOSIS — Z952 Presence of prosthetic heart valve: Secondary | ICD-10-CM

## 2019-12-25 NOTE — Patient Instructions (Signed)

## 2019-12-26 ENCOUNTER — Telehealth: Payer: Self-pay

## 2019-12-26 DIAGNOSIS — I350 Nonrheumatic aortic (valve) stenosis: Secondary | ICD-10-CM

## 2019-12-26 DIAGNOSIS — I119 Hypertensive heart disease without heart failure: Secondary | ICD-10-CM

## 2019-12-26 LAB — BASIC METABOLIC PANEL
BUN/Creatinine Ratio: 14 (ref 12–28)
BUN: 21 mg/dL (ref 8–27)
CO2: 25 mmol/L (ref 20–29)
Calcium: 9.7 mg/dL (ref 8.7–10.3)
Chloride: 102 mmol/L (ref 96–106)
Creatinine, Ser: 1.46 mg/dL — ABNORMAL HIGH (ref 0.57–1.00)
GFR calc Af Amer: 38 mL/min/{1.73_m2} — ABNORMAL LOW (ref >59)
GFR calc non Af Amer: 33 mL/min/{1.73_m2} — ABNORMAL LOW (ref >59)
Glucose: 120 mg/dL — ABNORMAL HIGH (ref 65–99)
Potassium: 4.2 mmol/L (ref 3.5–5.2)
Sodium: 141 mmol/L (ref 134–144)

## 2019-12-26 LAB — CBC
Hematocrit: 36.4 % (ref 34.0–46.6)
Hemoglobin: 11.9 g/dL (ref 11.1–15.9)
MCH: 27.5 pg (ref 26.6–33.0)
MCHC: 32.7 g/dL (ref 31.5–35.7)
MCV: 84 fL (ref 79–97)
Platelets: 232 10*3/uL (ref 150–450)
RBC: 4.33 x10E6/uL (ref 3.77–5.28)
RDW: 13.4 % (ref 11.7–15.4)
WBC: 7.2 10*3/uL (ref 3.4–10.8)

## 2019-12-26 LAB — PRO B NATRIURETIC PEPTIDE: NT-Pro BNP: 1033 pg/mL — ABNORMAL HIGH (ref 0–738)

## 2019-12-26 NOTE — Telephone Encounter (Signed)
-----   Message from Richardo Priest, MD sent at 12/26/2019  7:44 AM EDT ----- Normal or stable result  Some change in kidney function lites reduce her furosemide only Monday Wednesday and Friday 1 tablet and recheck her BMP in about 2 weeks

## 2019-12-26 NOTE — Telephone Encounter (Signed)
Spoke with patient regarding results and recommendation.  Patient verbalizes understanding and is agreeable to plan of care. Advised patient to call back with any issues or concerns.  

## 2020-01-07 ENCOUNTER — Telehealth: Payer: Self-pay

## 2020-01-07 DIAGNOSIS — I119 Hypertensive heart disease without heart failure: Secondary | ICD-10-CM | POA: Diagnosis not present

## 2020-01-07 DIAGNOSIS — N390 Urinary tract infection, site not specified: Secondary | ICD-10-CM | POA: Diagnosis not present

## 2020-01-07 DIAGNOSIS — I350 Nonrheumatic aortic (valve) stenosis: Secondary | ICD-10-CM | POA: Diagnosis not present

## 2020-01-07 LAB — BASIC METABOLIC PANEL
BUN/Creatinine Ratio: 18 (ref 12–28)
BUN: 22 mg/dL (ref 8–27)
CO2: 21 mmol/L (ref 20–29)
Calcium: 9.9 mg/dL (ref 8.7–10.3)
Chloride: 101 mmol/L (ref 96–106)
Creatinine, Ser: 1.25 mg/dL — ABNORMAL HIGH (ref 0.57–1.00)
GFR calc Af Amer: 45 mL/min/{1.73_m2} — ABNORMAL LOW (ref >59)
GFR calc non Af Amer: 39 mL/min/{1.73_m2} — ABNORMAL LOW (ref >59)
Glucose: 117 mg/dL — ABNORMAL HIGH (ref 65–99)
Potassium: 4.5 mmol/L (ref 3.5–5.2)
Sodium: 138 mmol/L (ref 134–144)

## 2020-01-07 NOTE — Telephone Encounter (Signed)
Left message on patients voicemail to please return our call.   

## 2020-01-07 NOTE — Telephone Encounter (Signed)
Patient returning call.

## 2020-01-07 NOTE — Telephone Encounter (Signed)
-----   Message from Richardo Priest, MD sent at 01/07/2020  3:45 PM EDT ----- Normal or stable result  No changes

## 2020-01-07 NOTE — Telephone Encounter (Signed)
Spoke with patient regarding results and recommendation.  Patient verbalizes understanding and is agreeable to plan of care. Advised patient to call back with any issues or concerns.  

## 2020-01-08 ENCOUNTER — Telehealth: Payer: Self-pay | Admitting: Physician Assistant

## 2020-01-08 NOTE — Telephone Encounter (Signed)
  [facility]   MULTIDISCIPLINARY HEART VALVE TEAM   Pt scheduled for abdominal CT on 6/24 with and without contrast to evaluate a pancreatic lesion found on pre TAVR CT scans. BMET from yesterday shows stable creat with a GFR of 39. She needs to hold her Benazepril and lasix on 6/23 and 6/24. Okay to resume after.   Angelena Form PA-C  [facility]

## 2020-01-08 NOTE — Telephone Encounter (Signed)
I spoke with the pt and advised her of medication instructions in regards to preparation for 6/24 CT scan. The pt verbalized understanding of instructions.

## 2020-01-09 NOTE — Telephone Encounter (Signed)
Thank you! Richardson Dopp, PA-C    01/09/2020 5:39 PM

## 2020-01-10 ENCOUNTER — Ambulatory Visit (INDEPENDENT_AMBULATORY_CARE_PROVIDER_SITE_OTHER)
Admission: RE | Admit: 2020-01-10 | Discharge: 2020-01-10 | Disposition: A | Discharge status: Home / Self Care | Payer: Medicare Other | Source: Ambulatory Visit | Attending: Physician Assistant | Admitting: Physician Assistant

## 2020-01-10 ENCOUNTER — Other Ambulatory Visit: Payer: Self-pay

## 2020-01-10 DIAGNOSIS — K862 Cyst of pancreas: Secondary | ICD-10-CM

## 2020-01-10 DIAGNOSIS — K449 Diaphragmatic hernia without obstruction or gangrene: Secondary | ICD-10-CM | POA: Diagnosis not present

## 2020-01-10 MED ORDER — IOHEXOL 300 MG/ML  SOLN
70.0000 mL | Freq: Once | INTRAMUSCULAR | Status: AC | PRN
  Administered 2020-01-10: 70 mL via INTRAVENOUS

## 2020-01-23 DIAGNOSIS — K2101 Gastro-esophageal reflux disease with esophagitis, with bleeding: Secondary | ICD-10-CM | POA: Diagnosis not present

## 2020-02-07 DIAGNOSIS — L570 Actinic keratosis: Secondary | ICD-10-CM | POA: Diagnosis not present

## 2020-02-14 DIAGNOSIS — D631 Anemia in chronic kidney disease: Secondary | ICD-10-CM | POA: Diagnosis not present

## 2020-02-14 DIAGNOSIS — N189 Chronic kidney disease, unspecified: Secondary | ICD-10-CM | POA: Diagnosis not present

## 2020-02-14 DIAGNOSIS — N2581 Secondary hyperparathyroidism of renal origin: Secondary | ICD-10-CM | POA: Diagnosis not present

## 2020-02-14 DIAGNOSIS — I129 Hypertensive chronic kidney disease with stage 1 through stage 4 chronic kidney disease, or unspecified chronic kidney disease: Secondary | ICD-10-CM | POA: Diagnosis not present

## 2020-02-14 DIAGNOSIS — N1831 Chronic kidney disease, stage 3a: Secondary | ICD-10-CM | POA: Diagnosis not present

## 2020-02-14 DIAGNOSIS — Z952 Presence of prosthetic heart valve: Secondary | ICD-10-CM | POA: Diagnosis not present

## 2020-03-18 DIAGNOSIS — E119 Type 2 diabetes mellitus without complications: Secondary | ICD-10-CM | POA: Diagnosis not present

## 2020-03-20 DIAGNOSIS — C20 Malignant neoplasm of rectum: Secondary | ICD-10-CM | POA: Diagnosis not present

## 2020-03-20 DIAGNOSIS — Z85048 Personal history of other malignant neoplasm of rectum, rectosigmoid junction, and anus: Secondary | ICD-10-CM | POA: Diagnosis not present

## 2020-03-21 DIAGNOSIS — N390 Urinary tract infection, site not specified: Secondary | ICD-10-CM | POA: Diagnosis not present

## 2020-03-21 DIAGNOSIS — R339 Retention of urine, unspecified: Secondary | ICD-10-CM | POA: Diagnosis not present

## 2020-04-02 DIAGNOSIS — N183 Chronic kidney disease, stage 3 unspecified: Secondary | ICD-10-CM | POA: Diagnosis not present

## 2020-04-02 DIAGNOSIS — E785 Hyperlipidemia, unspecified: Secondary | ICD-10-CM | POA: Diagnosis not present

## 2020-04-02 DIAGNOSIS — E559 Vitamin D deficiency, unspecified: Secondary | ICD-10-CM | POA: Diagnosis not present

## 2020-04-02 DIAGNOSIS — I129 Hypertensive chronic kidney disease with stage 1 through stage 4 chronic kidney disease, or unspecified chronic kidney disease: Secondary | ICD-10-CM | POA: Diagnosis not present

## 2020-04-02 DIAGNOSIS — K219 Gastro-esophageal reflux disease without esophagitis: Secondary | ICD-10-CM | POA: Diagnosis not present

## 2020-04-02 DIAGNOSIS — R7303 Prediabetes: Secondary | ICD-10-CM | POA: Diagnosis not present

## 2020-04-17 DIAGNOSIS — Z0000 Encounter for general adult medical examination without abnormal findings: Secondary | ICD-10-CM | POA: Diagnosis not present

## 2020-04-17 DIAGNOSIS — Z139 Encounter for screening, unspecified: Secondary | ICD-10-CM | POA: Diagnosis not present

## 2020-04-17 DIAGNOSIS — Z9181 History of falling: Secondary | ICD-10-CM | POA: Diagnosis not present

## 2020-04-17 DIAGNOSIS — E785 Hyperlipidemia, unspecified: Secondary | ICD-10-CM | POA: Diagnosis not present

## 2020-05-12 ENCOUNTER — Telehealth: Payer: Self-pay | Admitting: Cardiology

## 2020-05-12 NOTE — Telephone Encounter (Signed)
Patient is requesting to be worked into Dr. Joya Gaskins schedule for a sooner appointment due to experiencing sweats and nausea over the weekend. She reports her BP is 158/86 and her HR is 81. She also states she is experiencing a lot of pressure in her head.

## 2020-05-12 NOTE — Telephone Encounter (Signed)
05/10/20 Friday am after eating breakfast and going back to bed she woke up in a sweat, nausea and not feeling well. Pt states that she has had feeling of lightlessness and that she feels tired.  138/77 HR 72. Pt denies chest pain. Appointment made for sooner appointment.

## 2020-05-26 ENCOUNTER — Other Ambulatory Visit: Payer: Self-pay

## 2020-05-26 DIAGNOSIS — M1990 Unspecified osteoarthritis, unspecified site: Secondary | ICD-10-CM | POA: Insufficient documentation

## 2020-05-26 DIAGNOSIS — R0600 Dyspnea, unspecified: Secondary | ICD-10-CM | POA: Insufficient documentation

## 2020-05-26 DIAGNOSIS — C20 Malignant neoplasm of rectum: Secondary | ICD-10-CM | POA: Insufficient documentation

## 2020-05-27 ENCOUNTER — Ambulatory Visit: Payer: Medicare Other | Admitting: Cardiology

## 2020-05-27 ENCOUNTER — Encounter: Payer: Self-pay | Admitting: Cardiology

## 2020-05-27 ENCOUNTER — Other Ambulatory Visit: Payer: Self-pay

## 2020-05-27 VITALS — BP 169/80 | HR 86 | Ht 59.0 in | Wt 155.0 lb

## 2020-05-27 DIAGNOSIS — I5032 Chronic diastolic (congestive) heart failure: Secondary | ICD-10-CM | POA: Diagnosis not present

## 2020-05-27 DIAGNOSIS — I110 Hypertensive heart disease with heart failure: Secondary | ICD-10-CM

## 2020-05-27 DIAGNOSIS — Z952 Presence of prosthetic heart valve: Secondary | ICD-10-CM | POA: Diagnosis not present

## 2020-05-27 DIAGNOSIS — I342 Nonrheumatic mitral (valve) stenosis: Secondary | ICD-10-CM

## 2020-05-27 DIAGNOSIS — N1831 Chronic kidney disease, stage 3a: Secondary | ICD-10-CM

## 2020-05-27 NOTE — Progress Notes (Signed)
Cardiology Office Note:    Date:  05/27/2020   ID:  Hannah Briggs, DOB 1932/03/21, MRN [id number]  PCP:  Renaldo Reel, PA  Cardiologist:  Shirlee More, MD    Referring MD: Renaldo Reel, PA    ASSESSMENT:    1. S/P TAVR (transcatheter aortic valve replacement)   2. Hypertensive heart disease with chronic diastolic congestive heart failure (HCC)   3. Stage 3a chronic kidney disease ([place])   4. Nonrheumatic mitral valve stenosis    PLAN:    In order of problems listed above:  1. She continues to improve after TAVR not having cardiovascular symptoms presently on single antiplatelet aspirin and her echocardiogram showed good valve function. 2. Stable BP at home has been at target continue her current antihypertensive regimen with amlodipine ACE inhibitor furosemide.  Most recent creatinine 1.120 02/14/2020 3. Stable we will plan to do yearly echocardiograms it is very uncommon to require mitral valve intervention with degenerative disease   Next appointment: I asked her to keep her appointment scheduled for December   Medication Adjustments/Labs and Tests Ordered: Current medicines are reviewed at length with the patient today.  Concerns regarding medicines are outlined above.  No orders of the defined types were placed in this encounter.  No orders of the defined types were placed in this encounter.   Chief Complaint  Patient presents with  . Follow-up    Following TAVR  . Hypertension  . Congestive Heart Failure  . Hyperlipidemia    History of Present Illness:    Hannah Briggs is a 86 y.o. female with a hx of severe symptomatic aortic stenosis with TAVR 11/06/2019 hypertension hyperlipidemia CKD and heart failure.  She was last seen 12/25/2019 and also has a small pulmonary nodule with recommendations for follow-up CT scan as well as a lesion in the head of the pancreas. Compliance with diet, lifestyle and medications: Yes, however due to headache she stopped  lipid-lowering medications and she is also off clopidogrel.  She is slowly and steadily improved after TAVR and is not having cardiovascular symptoms of chest pain edema shortness of breath palpitation or syncope.  She has a chronic problem where she wakes early in the morning 2 weeks ago it happened she got before in the morning made a breakfast including biscuits sausage went back to bed and woke up nauseous vomited was diaphoretic weak it passed in about 30 minutes and she wonders if she had a heart attack.  We will check an EKG before she leaves my office there is no chest pain.  She is placed herself back on Zetia which is tolerated and will wait a week or 2 and put herself back on pravastatin and I told her to stay off fenofibrate.  She no longer takes clopidogrel.  Her echocardiogram post TAVR shows a normally functioning valve with a mean gradient of 13.7 mmHg and no paravalvular regurgitation.  She has nonrheumatic mitral stenosis.  At right left heart catheterization there is a 5 mm gradient between pulmonary artery diastolic pressure and LVEDP.  I independently reviewed her last echocardiogram and she has very severe mitral annular calcification and thickening of the mitral leaflets degenerative.  I think the degree of mitral stenosis was quite overestimated.  Block (to the afternoon Past Medical History:  Diagnosis Date  . Acute on chronic diastolic heart failure ([place]) 11/06/2019  . Arthritis   . CKD (chronic kidney disease), stage III ([place])   . Dyslipidemia 03/08/2017  Statin intolerance  . Dyspnea   . GERD (gastroesophageal reflux disease) 03/08/2017  . Hemoptysis 04/04/2018  . Hypertensive heart disease 03/08/2017  . Malignant neoplasm of rectum ([place]) 03/11/2017  . Mitral stenosis   . Pre-diabetes   . Prediabetes 03/08/2017  . Pure hypercholesterolemia 04/08/2015   Overview:  Intolerant of pravastatin and simvastatin  . Rectal cancer ([place])   . S/P TAVR (transcatheter aortic valve  replacement) 11/06/2019   s/p TAVR w/ a 23 mm Edwards S3U via the TF approach by Dr. Angelena Form & Cyndia Bent .  Marland Kitchen Severe aortic stenosis   . Vitamin D deficiency 03/08/2017    Past Surgical History:  Procedure Laterality Date  . APPENDECTOMY    . BLADDER SURGERY    . BREAST BIOPSY    . CESAREAN SECTION    . CHOLECYSTECTOMY    . COLON SURGERY    . EUS N/A 02/24/2017   Procedure: LOWER ENDOSCOPIC ULTRASOUND (EUS);  Surgeon: Milus Banister, MD;  Location: [facility];  Service: Endoscopy;  Laterality: N/A;  . EYE SURGERY    . RIGHT/LEFT HEART CATH AND CORONARY ANGIOGRAPHY N/A 10/15/2019   Procedure: RIGHT/LEFT HEART CATH AND CORONARY ANGIOGRAPHY;  Surgeon: Burnell Blanks, MD;  Location: [place] [facility];  Service: Cardiovascular;  Laterality: N/A;  . TEE WITHOUT CARDIOVERSION N/A 11/06/2019   Procedure: TRANSESOPHAGEAL ECHOCARDIOGRAM (TEE);  Surgeon: Burnell Blanks, MD;  Location: [place] [facility];  Service: Open Heart Surgery;  Laterality: N/A;  . TONSILLECTOMY    . TRANSCATHETER AORTIC VALVE REPLACEMENT, TRANSFEMORAL N/A 11/06/2019   Procedure: TRANSCATHETER AORTIC VALVE REPLACEMENT, TRANSFEMORAL;  Surgeon: Burnell Blanks, MD;  Location: [place] [facility];  Service: Open Heart Surgery;  Laterality: N/A;    Current Medications: Current Meds  Medication Sig  . acetaminophen (TYLENOL) 500 MG tablet Take 500 mg by mouth as needed.  Marland Kitchen aluminum hydroxide-magnesium carbonate (GAVISCON) 95-358 MG/15ML SUSP Take 30 mLs by mouth at bedtime. Hiatal hernia  . amLODipine (NORVASC) 10 MG tablet Take 10 mg by mouth daily.   Marland Kitchen aspirin EC 81 MG tablet Take 81 mg by mouth daily.  . benazepril (LOTENSIN) 40 MG tablet Take 40 mg by mouth daily.  . Calcium Carb-Cholecalciferol (CALCIUM 600+D3 PO) Take 1 tablet by mouth daily.  . calcium carbonate (TUMS EX) 750 MG chewable tablet Chew 1 tablet by mouth 4 (four) times daily as needed for heartburn.   . cephALEXin (KEFLEX) 250 MG  capsule Take 250 mg by mouth daily.  . Cetirizine HCl (ZYRTEC ALLERGY PO) Take by mouth.  . Cholecalciferol (VITAMIN D-3) 125 MCG (5000 UT) TABS Take 5,000 Units by mouth daily.  . Coenzyme Q10 (COQ10) 100 MG CAPS Take 100 mg by mouth at bedtime.  Marland Kitchen ezetimibe (ZETIA) 10 MG tablet Take 10 mg by mouth every evening.  . furosemide (LASIX) 20 MG tablet Take 20 mg by mouth 3 (three) times a week.  . Meth-Hyo-M Bl-Na Phos-Ph Sal (URO-MP) 118 MG CAPS Take by mouth.  . metoprolol succinate (TOPROL-XL) 25 MG 24 hr tablet Take 25 mg by mouth daily.  . Multiple Vitamin (MULTIVITAMIN WITH MINERALS) TABS tablet Take 1 tablet by mouth daily.  Marland Kitchen nystatin cream (MYCOSTATIN) Apply 1 application topically 2 (two) times daily as needed (irritation).  Marland Kitchen omeprazole (PRILOSEC) 20 MG capsule Take 20 mg by mouth daily before breakfast.   . Potassium 99 MG TABS Take 99 mg by mouth daily.  . Probiotic Product (PROBIOTIC PO) Take 1 capsule by mouth  daily.  . tiZANidine (ZANAFLEX) 2 MG tablet Take 2 mg by mouth at bedtime as needed for muscle spasms.   . Wheat Dextrin (BENEFIBER DRINK MIX) PACK Take 1 Package by mouth every other day as needed (constipation.).      Allergies:   Claritin-d 12 hour [loratadine-pseudoephedrine er], Codeine, Lisinopril, and Statins   Social History   Socioeconomic History  . Marital status: Widowed    Spouse name: Not on file  . Number of children: Not on file  . Years of education: Not on file  . Highest education level: Not on file  Occupational History  . Not on file  Tobacco Use  . Smoking status: Never Smoker  . Smokeless tobacco: Never Used  Vaping Use  . Vaping Use: Never used  Substance and Sexual Activity  . Alcohol use: No  . Drug use: No  . Sexual activity: Not on file  Other Topics Concern  . Not on file  Social History Narrative  . Not on file   Social Determinants of Health   Financial Resource Strain:   . Difficulty of Paying Living Expenses: Not on file   Food Insecurity:   . Worried About Charity fundraiser in the Last Year: Not on file  . Ran Out of Food in the Last Year: Not on file  Transportation Needs:   . Lack of Transportation (Medical): Not on file  . Lack of Transportation (Non-Medical): Not on file  Physical Activity:   . Days of Exercise per Week: Not on file  . Minutes of Exercise per Session: Not on file  Stress:   . Feeling of Stress : Not on file  Social Connections:   . Frequency of Communication with Friends and Family: Not on file  . Frequency of Social Gatherings with Friends and Family: Not on file  . Attends Religious Services: Not on file  . Active Member of Clubs or Organizations: Not on file  . Attends Archivist Meetings: Not on file  . Marital Status: Not on file     Family History: The patient's family history includes AAA (abdominal aortic aneurysm) in her brother; Congestive Heart Failure in her father; Heart attack in her mother; Heart disease in her sister; Parkinson's disease in her sister. ROS:   Please see the history of present illness.    All other systems reviewed and are negative.  EKGs/Labs/Other Studies Reviewed:    The following studies were reviewed today:  EKG:  EKG ordered today and personally reviewed.  The ekg ordered today demonstrates sinus rhythm left axis deviation otherwise normal  Recent Labs: 11/02/2019: ALT 19; B Natriuretic Peptide 297.2 11/07/2019: Magnesium 2.1 12/25/2019: Hemoglobin 11.9; NT-Pro BNP 1,033; Platelets 232 01/07/2020: BUN 22; Creatinine, Ser 1.25; Potassium 4.5; Sodium 138  Recent Lipid Panel No results found for: CHOL, TRIG, HDL, CHOLHDL, VLDL, LDLCALC, LDLDIRECT  Physical Exam:    VS:  BP (!) 169/80   Pulse 86   Ht 4\' 11"  (1.499 m)   Wt 155 lb (70.3 kg)   SpO2 97%   BMI 31.31 kg/m     Wt Readings from Last 3 Encounters:  05/27/20 155 lb (70.3 kg)  12/25/19 152 lb 3.2 oz (69 kg)  12/18/19 153 lb 1.9 oz (69.5 kg)     GEN: She looks  her age well nourished, well developed in no acute distress HEENT: Normal NECK: No JVD; No carotid bruits LYMPHATICS: No lymphadenopathy CARDIAC: No murmur RRR, no murmurs, rubs, gallops RESPIRATORY:  Clear to auscultation without rales, wheezing or rhonchi  ABDOMEN: Soft, non-tender, non-distended MUSCULOSKELETAL:  No edema; No deformity  SKIN: Warm and dry NEUROLOGIC:  Alert and oriented x 3 PSYCHIATRIC:  Normal affect    Signed, Shirlee More, MD  05/27/2020 2:21 PM    [place]

## 2020-05-27 NOTE — Patient Instructions (Signed)
Medication Instructions:  Your physician recommends that you continue on your current medications as directed. Please refer to the Current Medication list given to you today.  *If you need a refill on your cardiac medications before your next appointment, please call your pharmacy*   Lab Work: None If you have labs (blood work) drawn today and your tests are completely normal, you will receive your results only by: Marland Kitchen MyChart Message (if you have MyChart) OR . A paper copy in the mail If you have any lab test that is abnormal or we need to change your treatment, we will call you to review the results.   Testing/Procedures: None   Follow-Up: At [facility], you and your health needs are our priority.  As part of our continuing mission to provide you with exceptional heart care, we have created designated Provider Care Teams.  These Care Teams include your primary Cardiologist (physician) and Advanced Practice Providers (APPs -  Physician Assistants and Nurse Practitioners) who all work together to provide you with the care you need, when you need it.  We recommend signing up for the patient portal called "MyChart".  Sign up information is provided on this After Visit Summary.  MyChart is used to connect with patients for Virtual Visits (Telemedicine).  Patients are able to view lab/test results, encounter notes, upcoming appointments, etc.  Non-urgent messages can be sent to your provider as well.   To learn more about what you can do with MyChart, go to [email or web address].    Your next appointment:   1 month  The format for your next appointment:   In Person  Provider:   Shirlee More, MD   Other Instructions

## 2020-05-28 LAB — BASIC METABOLIC PANEL
BUN/Creatinine Ratio: 23 (ref 12–28)
BUN: 27 mg/dL (ref 8–27)
CO2: 25 mmol/L (ref 20–29)
Calcium: 10.2 mg/dL (ref 8.7–10.3)
Chloride: 99 mmol/L (ref 96–106)
Creatinine, Ser: 1.19 mg/dL — ABNORMAL HIGH (ref 0.57–1.00)
GFR calc Af Amer: 48 mL/min/{1.73_m2} — ABNORMAL LOW (ref >59)
GFR calc non Af Amer: 41 mL/min/{1.73_m2} — ABNORMAL LOW (ref >59)
Glucose: 105 mg/dL — ABNORMAL HIGH (ref 65–99)
Potassium: 4.6 mmol/L (ref 3.5–5.2)
Sodium: 142 mmol/L (ref 134–144)

## 2020-05-28 LAB — CBC
Hematocrit: 38.6 % (ref 34.0–46.6)
Hemoglobin: 12.9 g/dL (ref 11.1–15.9)
MCH: 27.7 pg (ref 26.6–33.0)
MCHC: 33.4 g/dL (ref 31.5–35.7)
MCV: 83 fL (ref 79–97)
Platelets: 209 10*3/uL (ref 150–450)
RBC: 4.66 x10E6/uL (ref 3.77–5.28)
RDW: 13.9 % (ref 11.7–15.4)
WBC: 6.4 10*3/uL (ref 3.4–10.8)

## 2020-05-28 NOTE — Progress Notes (Signed)
I was able to speak with the patient and relayed that her labs look good, no changes at this time. She thanked me for the call, no further questions.

## 2020-05-29 ENCOUNTER — Telehealth: Payer: Self-pay | Admitting: Cardiology

## 2020-05-29 NOTE — Telephone Encounter (Signed)
Spoke to the patient just now and let her know that I have not seen this green folder in our office but would look out for it. She verbalizes understanding and thanks me for the call back.    Encouraged patient to call back with any questions or concerns.

## 2020-05-29 NOTE — Telephone Encounter (Signed)
New message:    Patient calling to see if she left a green folder with all medical information, she had apt on Tuesday. Please call patient.

## 2020-06-02 DIAGNOSIS — N390 Urinary tract infection, site not specified: Secondary | ICD-10-CM | POA: Diagnosis not present

## 2020-06-22 NOTE — Progress Notes (Signed)
Cardiology Office Note:    Date:  06/23/2020   ID:  Hannah Briggs, DOB [birth year], MRN [id number]  PCP:  Renaldo Reel, PA  Cardiologist:  Shirlee More, MD    Referring MD: Renaldo Reel, PA    ASSESSMENT:    1. S/P TAVR (transcatheter aortic valve replacement)   2. Hypertensive heart disease with chronic diastolic congestive heart failure (HCC)   3. Stage 3a chronic kidney disease ([place])   4. Pure hypercholesterolemia    PLAN:    In order of problems listed above:  1. He has made a good functional recovery from her TAVR no indication of valve dysfunction 2. Stable BP at target on current treatment and heart failure is compensated continue her diuretic low-dose beta-blocker and ACE inhibitor 3. Stable CKD recent labs 4. She is back on her statin with follow-up labs with her PCP   Next appointment: 6 months   Medication Adjustments/Labs and Tests Ordered: Current medicines are reviewed at length with the patient today.  Concerns regarding medicines are outlined above.  No orders of the defined types were placed in this encounter.  No orders of the defined types were placed in this encounter.   Chief Complaint  Patient presents with  . Follow-up    Following TAVR 10/19/2019    History of Present Illness:    Hannah Briggs is a 86 y.o. female with a hx of symptomatic severe aortic stenosis with TAVR 10/19/2019, hypertensive heart disease with CKD and heart failure and hyper lipidemia.  She also has a small pulmonary nodule as well as a lesion in the head of the pancreas. last seen 05/27/2020.  Compliance with diet, lifestyle and medications: Yes  Fortunately she is slowly and steadily improving and is just about recovered from travel.  She still finds her self fatigued.  We discussed stopping her diuretic and she still has intermittent edema and wants to continue.  She is on lipid-lowering therapy and is taking pravastatin since her last visit.  No shortness of breath  chest pain palpitation or syncope.  Most recent labs from her primary care physician 05/27/2020 creatinine 1.919 BUN 27 most recent lipid profile 04/02/2020 cholesterol 204 LDL 120 triglycerides 105 HDL 66  She also has mild nonrheumatic mitral valve stenosis due to degenerative disease.Her echocardiogram post TAVR shows a normally functioning valve with a mean gradient of 13.7 mmHg and no paravalvular regurgitation.  She has nonrheumatic mitral stenosis.  At right left heart catheterization there is a 5 mm gradient between pulmonary artery diastolic pressure and LVEDP.  I independently reviewed her last echocardiogram and she has very severe mitral annular calcification and thickening of the mitral leaflets degenerative.  I think the degree of mitral stenosis was quite overestimated.   Past Medical History:  Diagnosis Date  . Acute on chronic diastolic heart failure ([place]) 11/06/2019  . Arthritis   . CKD (chronic kidney disease), stage III ([place])   . Dyslipidemia 03/08/2017   Statin intolerance  . Dyspnea   . GERD (gastroesophageal reflux disease) 03/08/2017  . Hemoptysis 04/04/2018  . Hypertensive heart disease 03/08/2017  . Malignant neoplasm of rectum ([place]) 03/11/2017  . Mitral stenosis   . Pre-diabetes   . Prediabetes 03/08/2017  . Pure hypercholesterolemia 04/08/2015   Overview:  Intolerant of pravastatin and simvastatin  . Rectal cancer ([place])   . S/P TAVR (transcatheter aortic valve replacement) 11/06/2019   s/p TAVR w/ a 23 mm Edwards S3U via the TF approach by Dr.  McAlhany & Bartle .  Marland Kitchen Severe aortic stenosis   . Vitamin D deficiency 03/08/2017    Past Surgical History:  Procedure Laterality Date  . APPENDECTOMY    . BLADDER SURGERY    . BREAST BIOPSY    . CESAREAN SECTION    . CHOLECYSTECTOMY    . COLON SURGERY    . EUS N/A 02/24/2017   Procedure: LOWER ENDOSCOPIC ULTRASOUND (EUS);  Surgeon: Milus Banister, MD;  Location: [facility];  Service: Endoscopy;  Laterality: N/A;  . EYE  SURGERY    . RIGHT/LEFT HEART CATH AND CORONARY ANGIOGRAPHY N/A 10/15/2019   Procedure: RIGHT/LEFT HEART CATH AND CORONARY ANGIOGRAPHY;  Surgeon: Burnell Blanks, MD;  Location: [place] [facility];  Service: Cardiovascular;  Laterality: N/A;  . TEE WITHOUT CARDIOVERSION N/A 11/06/2019   Procedure: TRANSESOPHAGEAL ECHOCARDIOGRAM (TEE);  Surgeon: Burnell Blanks, MD;  Location: [place] [facility];  Service: Open Heart Surgery;  Laterality: N/A;  . TONSILLECTOMY    . TRANSCATHETER AORTIC VALVE REPLACEMENT, TRANSFEMORAL N/A 11/06/2019   Procedure: TRANSCATHETER AORTIC VALVE REPLACEMENT, TRANSFEMORAL;  Surgeon: Burnell Blanks, MD;  Location: [place] [facility];  Service: Open Heart Surgery;  Laterality: N/A;    Current Medications: Current Meds  Medication Sig  . acetaminophen (TYLENOL) 500 MG tablet Take 500 mg by mouth as needed.  Marland Kitchen aluminum hydroxide-magnesium carbonate (GAVISCON) 95-358 MG/15ML SUSP Take 30 mLs by mouth at bedtime. Hiatal hernia  . amLODipine (NORVASC) 10 MG tablet Take 10 mg by mouth daily.   Marland Kitchen aspirin EC 81 MG tablet Take 81 mg by mouth daily.  . benazepril (LOTENSIN) 40 MG tablet Take 40 mg by mouth daily.  . Calcium Carb-Cholecalciferol (CALCIUM 600+D3 PO) Take 1 tablet by mouth daily.  . calcium carbonate (TUMS EX) 750 MG chewable tablet Chew 1 tablet by mouth 4 (four) times daily as needed for heartburn.   . cephALEXin (KEFLEX) 250 MG capsule Take 250 mg by mouth daily.  . Cetirizine HCl (ZYRTEC ALLERGY PO) Take by mouth.  . Cholecalciferol (VITAMIN D-3) 125 MCG (5000 UT) TABS Take 5,000 Units by mouth daily.  . Coenzyme Q10 (COQ10) 100 MG CAPS Take 100 mg by mouth at bedtime.  Marland Kitchen ezetimibe (ZETIA) 10 MG tablet Take 10 mg by mouth every evening.  . furosemide (LASIX) 20 MG tablet Take 20 mg by mouth 3 (three) times a week.  . Meth-Hyo-M Bl-Na Phos-Ph Sal (URO-MP) 118 MG CAPS Take by mouth.  . metoprolol succinate (TOPROL-XL) 25 MG 24 hr tablet  Take 25 mg by mouth daily.  . Multiple Vitamin (MULTIVITAMIN WITH MINERALS) TABS tablet Take 1 tablet by mouth daily.  Marland Kitchen nystatin cream (MYCOSTATIN) Apply 1 application topically 2 (two) times daily as needed (irritation).  Marland Kitchen omeprazole (PRILOSEC) 20 MG capsule Take 20 mg by mouth daily before breakfast.   . Potassium 99 MG TABS Take 99 mg by mouth daily.  . Probiotic Product (PROBIOTIC PO) Take 1 capsule by mouth daily.  Marland Kitchen tiZANidine (ZANAFLEX) 2 MG tablet Take 2 mg by mouth at bedtime as needed for muscle spasms.   Marland Kitchen trimethoprim (TRIMPEX) 100 MG tablet Take 100 mg by mouth daily.  . Wheat Dextrin (BENEFIBER DRINK MIX) PACK Take 1 Package by mouth every other day as needed (constipation.).      Allergies:   Claritin-d 12 hour [loratadine-pseudoephedrine er], Codeine, Lisinopril, and Statins   Social History   Socioeconomic History  . Marital status: Widowed    Spouse name: Not  on file  . Number of children: Not on file  . Years of education: Not on file  . Highest education level: Not on file  Occupational History  . Not on file  Tobacco Use  . Smoking status: Never Smoker  . Smokeless tobacco: Never Used  Vaping Use  . Vaping Use: Never used  Substance and Sexual Activity  . Alcohol use: No  . Drug use: No  . Sexual activity: Not on file  Other Topics Concern  . Not on file  Social History Narrative  . Not on file   Social Determinants of Health   Financial Resource Strain:   . Difficulty of Paying Living Expenses: Not on file  Food Insecurity:   . Worried About Charity fundraiser in the Last Year: Not on file  . Ran Out of Food in the Last Year: Not on file  Transportation Needs:   . Lack of Transportation (Medical): Not on file  . Lack of Transportation (Non-Medical): Not on file  Physical Activity:   . Days of Exercise per Week: Not on file  . Minutes of Exercise per Session: Not on file  Stress:   . Feeling of Stress : Not on file  Social Connections:   .  Frequency of Communication with Friends and Family: Not on file  . Frequency of Social Gatherings with Friends and Family: Not on file  . Attends Religious Services: Not on file  . Active Member of Clubs or Organizations: Not on file  . Attends Archivist Meetings: Not on file  . Marital Status: Not on file     Family History: The patient's family history includes AAA (abdominal aortic aneurysm) in her brother; Congestive Heart Failure in her father; Heart attack in her mother; Heart disease in her sister; Parkinson's disease in her sister. ROS:   Please see the history of present illness.    All other systems reviewed and are negative.  EKGs/Labs/Other Studies Reviewed:    The following studies were reviewed today:    Recent Labs: 11/02/2019: ALT 19; B Natriuretic Peptide 297.2 11/07/2019: Magnesium 2.1 12/25/2019: NT-Pro BNP 1,033 05/27/2020: BUN 27; Creatinine, Ser 1.19; Hemoglobin 12.9; Platelets 209; Potassium 4.6; Sodium 142    Physical Exam:    VS:  BP (!) 158/60   Pulse 86   Ht 4\' 11"  (1.499 m)   Wt 150 lb 12.8 oz (68.4 kg)   SpO2 96%   BMI 30.46 kg/m     Wt Readings from Last 3 Encounters:  06/23/20 150 lb 12.8 oz (68.4 kg)  05/27/20 155 lb (70.3 kg)  12/25/19 152 lb 3.2 oz (69 kg)     GEN: She looks much improved healthy her age well nourished, well developed in no acute distress HEENT: Normal NECK: No JVD; No carotid bruits LYMPHATICS: No lymphadenopathy CARDIAC: RRR, no murmurs, rubs, gallops RESPIRATORY:  Clear to auscultation without rales, wheezing or rhonchi  ABDOMEN: Soft, non-tender, non-distended MUSCULOSKELETAL:  No edema; No deformity  SKIN: Warm and dry NEUROLOGIC:  Alert and oriented x 3 PSYCHIATRIC:  Normal affect    Signed, Shirlee More, MD  06/23/2020 11:04 AM    [place]

## 2020-06-23 ENCOUNTER — Ambulatory Visit: Payer: Medicare Other | Admitting: Cardiology

## 2020-06-23 ENCOUNTER — Other Ambulatory Visit: Payer: Self-pay

## 2020-06-23 ENCOUNTER — Encounter: Payer: Self-pay | Admitting: Cardiology

## 2020-06-23 VITALS — BP 132/60 | HR 86 | Ht 59.0 in | Wt 150.8 lb

## 2020-06-23 DIAGNOSIS — I5032 Chronic diastolic (congestive) heart failure: Secondary | ICD-10-CM

## 2020-06-23 DIAGNOSIS — Z952 Presence of prosthetic heart valve: Secondary | ICD-10-CM | POA: Diagnosis not present

## 2020-06-23 DIAGNOSIS — N1831 Chronic kidney disease, stage 3a: Secondary | ICD-10-CM | POA: Diagnosis not present

## 2020-06-23 DIAGNOSIS — I110 Hypertensive heart disease with heart failure: Secondary | ICD-10-CM | POA: Diagnosis not present

## 2020-06-23 DIAGNOSIS — N390 Urinary tract infection, site not specified: Secondary | ICD-10-CM | POA: Diagnosis not present

## 2020-06-23 DIAGNOSIS — R339 Retention of urine, unspecified: Secondary | ICD-10-CM | POA: Diagnosis not present

## 2020-06-23 DIAGNOSIS — E7800 Pure hypercholesterolemia, unspecified: Secondary | ICD-10-CM | POA: Diagnosis not present

## 2020-06-23 NOTE — Patient Instructions (Signed)

## 2020-07-02 DIAGNOSIS — N183 Chronic kidney disease, stage 3 unspecified: Secondary | ICD-10-CM | POA: Diagnosis not present

## 2020-07-02 DIAGNOSIS — I129 Hypertensive chronic kidney disease with stage 1 through stage 4 chronic kidney disease, or unspecified chronic kidney disease: Secondary | ICD-10-CM | POA: Diagnosis not present

## 2020-07-02 DIAGNOSIS — R7303 Prediabetes: Secondary | ICD-10-CM | POA: Diagnosis not present

## 2020-07-02 DIAGNOSIS — E559 Vitamin D deficiency, unspecified: Secondary | ICD-10-CM | POA: Diagnosis not present

## 2020-07-02 DIAGNOSIS — E785 Hyperlipidemia, unspecified: Secondary | ICD-10-CM | POA: Diagnosis not present

## 2020-07-02 DIAGNOSIS — K219 Gastro-esophageal reflux disease without esophagitis: Secondary | ICD-10-CM | POA: Diagnosis not present

## 2020-08-12 DIAGNOSIS — I129 Hypertensive chronic kidney disease with stage 1 through stage 4 chronic kidney disease, or unspecified chronic kidney disease: Secondary | ICD-10-CM | POA: Diagnosis not present

## 2020-09-09 ENCOUNTER — Other Ambulatory Visit: Payer: Self-pay | Admitting: Physician Assistant

## 2020-09-09 DIAGNOSIS — Z952 Presence of prosthetic heart valve: Secondary | ICD-10-CM

## 2020-10-30 DIAGNOSIS — N390 Urinary tract infection, site not specified: Secondary | ICD-10-CM | POA: Diagnosis not present

## 2020-10-30 DIAGNOSIS — R339 Retention of urine, unspecified: Secondary | ICD-10-CM | POA: Diagnosis not present

## 2020-11-05 DIAGNOSIS — L821 Other seborrheic keratosis: Secondary | ICD-10-CM | POA: Diagnosis not present

## 2020-11-05 DIAGNOSIS — D485 Neoplasm of uncertain behavior of skin: Secondary | ICD-10-CM | POA: Diagnosis not present

## 2020-11-05 DIAGNOSIS — L814 Other melanin hyperpigmentation: Secondary | ICD-10-CM | POA: Diagnosis not present

## 2020-11-05 DIAGNOSIS — D2239 Melanocytic nevi of other parts of face: Secondary | ICD-10-CM | POA: Diagnosis not present

## 2020-11-05 DIAGNOSIS — L570 Actinic keratosis: Secondary | ICD-10-CM | POA: Diagnosis not present

## 2020-11-12 DIAGNOSIS — C44212 Basal cell carcinoma of skin of right ear and external auricular canal: Secondary | ICD-10-CM | POA: Diagnosis not present

## 2020-11-18 DIAGNOSIS — C44519 Basal cell carcinoma of skin of other part of trunk: Secondary | ICD-10-CM | POA: Diagnosis not present

## 2020-12-17 ENCOUNTER — Ambulatory Visit (INDEPENDENT_AMBULATORY_CARE_PROVIDER_SITE_OTHER): Payer: Medicare Other

## 2020-12-17 ENCOUNTER — Other Ambulatory Visit: Payer: Self-pay

## 2020-12-17 DIAGNOSIS — Z952 Presence of prosthetic heart valve: Secondary | ICD-10-CM | POA: Diagnosis not present

## 2020-12-17 LAB — ECHOCARDIOGRAM COMPLETE
AR max vel: 1.19 cm2
AV Area VTI: 1.21 cm2
AV Area mean vel: 1.14 cm2
AV Mean grad: 18 mmHg
AV Peak grad: 29.8 mmHg
Ao pk vel: 2.73 m/s
Area-P 1/2: 3.27 cm2
MV VTI: 0.88 cm2
S' Lateral: 2 cm

## 2020-12-17 NOTE — Progress Notes (Signed)
Complete echocardiogram performed.  Jimmy Ingram Onnen RDCS, RVT  

## 2020-12-21 NOTE — Progress Notes (Signed)
Cardiology Office Note:    Date:  12/22/2020   ID:  Hannah Briggs, DOB June 10, 1932, MRN [id number]  PCP:  Hannah Reel, PA  Cardiologist:  Hannah More, MD    Referring MD: Hannah Reel, PA    ASSESSMENT:    1. S/P TAVR (transcatheter aortic valve replacement)   2. Hypertensive heart disease with chronic diastolic congestive heart failure (HCC)   3. Stage 3a chronic kidney disease ([place])   4. Pure hypercholesterolemia   5. Nonrheumatic mitral valve stenosis    PLAN:    In order of problems listed above:  1. She continues to do well after TAVR New York Heart Association class I no evidence of heart failure and asymptomatic from her calcific mitral valve disease.  I have told her looking at the hemodynamics of her right heart catheterization and echocardiogram I do not think she has had any significant progression.  She is on appropriate therapy including aspirin antihypertensives and lipid-lowering with low intensity statin 3 days a week and Zetia.  She is due for labs next week with her PCP to follow her CKD and lipid disorder. 2. Hypertension at target heart failure compensated continue her current loop diuretic and antihypertensive ACE inhibitor beta-blocker 3. Stable calcific mitral valve disease   Next appointment: 6 months   Medication Adjustments/Labs and Tests Ordered: Current medicines are reviewed at length with the patient today.  Concerns regarding medicines are outlined above.  No orders of the defined types were placed in this encounter.  No orders of the defined types were placed in this encounter.   Chief Complaint  Patient presents with  . Follow-up    After TAVR 10/19/2019, she also has calcific mitral valve disease and mitral stenosis and hypertensive heart disease with heart failure.  . Mitral Stenosis  . Congestive Heart Failure    History of Present Illness:    Hannah Briggs is a 87 y.o. female with a hx of aortic stenosis with TAVR 10/19/2019  hypertensive heart disease with heart failure and CKD, hyperlipidemia pulmonary nodule as well as a lesion of the head of the pancreas.  She was last seen 06/23/2020.  Echocardiogram 12/17/2020 shows normal left and right ventricular size and function.  TAVR function is normal.  She has calcific mitral valve disease with mitral stenosis.  At the time of left and right heart catheterizations 10/15/2019 pulmonary capillary wedge pressure mean was 30 with an LV end-diastolic pressure of 12 with the same gradient that we obtain an echocardiogram across her stenotic mitral valve.  Compliance with diet, lifestyle and medications: Yes  She has been a little frightened about her echocardiogram report I told her to the best of my knowledge her calcific mitral valve disease is unchanged. Fortunately she is asymptomatic New York Heart Association class I She is a very active woman and has no exercise intolerance shortness of breath edema chest pain or palpitation. She enjoys life. She will have labs performed with her PCP next week She tolerates her statin without muscle pain or weakness Past Medical History:  Diagnosis Date  . Acute on chronic diastolic heart failure ([place]) 11/06/2019  . Arthritis   . CKD (chronic kidney disease), stage III ([place])   . Dyslipidemia 03/08/2017   Statin intolerance  . Dyspnea   . GERD (gastroesophageal reflux disease) 03/08/2017  . Hemoptysis 04/04/2018  . Hypertensive heart disease 03/08/2017  . Malignant neoplasm of rectum ([place]) 03/11/2017  . Mitral stenosis   . Pre-diabetes   .  Prediabetes 03/08/2017  . Pure hypercholesterolemia 04/08/2015   Overview:  Intolerant of pravastatin and simvastatin  . Rectal cancer ([place])   . S/P TAVR (transcatheter aortic valve replacement) 11/06/2019   s/p TAVR w/ a 23 mm Edwards S3U via the TF approach by Dr. Angelena Form & Cyndia Bent .  Marland Kitchen Severe aortic stenosis   . Vitamin D deficiency 03/08/2017    Past Surgical History:  Procedure Laterality  Date  . APPENDECTOMY    . BLADDER SURGERY    . BREAST BIOPSY    . CESAREAN SECTION    . CHOLECYSTECTOMY    . COLON SURGERY    . EUS N/A 02/24/2017   Procedure: LOWER ENDOSCOPIC ULTRASOUND (EUS);  Surgeon: Milus Banister, MD;  Location: [facility];  Service: Endoscopy;  Laterality: N/A;  . EYE SURGERY    . RIGHT/LEFT HEART CATH AND CORONARY ANGIOGRAPHY N/A 10/15/2019   Procedure: RIGHT/LEFT HEART CATH AND CORONARY ANGIOGRAPHY;  Surgeon: Burnell Blanks, MD;  Location: [place] [facility];  Service: Cardiovascular;  Laterality: N/A;  . TEE WITHOUT CARDIOVERSION N/A 11/06/2019   Procedure: TRANSESOPHAGEAL ECHOCARDIOGRAM (TEE);  Surgeon: Burnell Blanks, MD;  Location: [place] [facility];  Service: Open Heart Surgery;  Laterality: N/A;  . TONSILLECTOMY    . TRANSCATHETER AORTIC VALVE REPLACEMENT, TRANSFEMORAL N/A 11/06/2019   Procedure: TRANSCATHETER AORTIC VALVE REPLACEMENT, TRANSFEMORAL;  Surgeon: Burnell Blanks, MD;  Location: [place] [facility];  Service: Open Heart Surgery;  Laterality: N/A;    Current Medications: Current Meds  Medication Sig  . acetaminophen (TYLENOL) 500 MG tablet Take 500 mg by mouth every 4 (four) hours as needed for mild pain.  Marland Kitchen aluminum hydroxide-magnesium carbonate (GAVISCON) 95-358 MG/15ML SUSP Take 30 mLs by mouth at bedtime. Hiatal hernia  . amLODipine (NORVASC) 10 MG tablet Take 10 mg by mouth daily.   Marland Kitchen amoxicillin (AMOXIL) 500 MG capsule Take 2gm (4 tablets) one hour prior to your dental procedures.  Marland Kitchen aspirin EC 81 MG tablet Take 81 mg by mouth daily.  . benazepril (LOTENSIN) 40 MG tablet Take 40 mg by mouth daily.  . Calcium Carb-Cholecalciferol (CALCIUM 600+D3 PO) Take 1 tablet by mouth daily.  . calcium carbonate (TUMS EX) 750 MG chewable tablet Chew 1 tablet by mouth 4 (four) times daily as needed for heartburn.   . cephALEXin (KEFLEX) 250 MG capsule Take 250 mg by mouth daily.  . Cetirizine HCl (ZYRTEC ALLERGY PO) Take by  mouth daily as needed (Allergies).  . Cholecalciferol (VITAMIN D-3) 125 MCG (5000 UT) TABS Take 5,000 Units by mouth daily.  . Coenzyme Q10 (COQ10) 100 MG CAPS Take 100 mg by mouth at bedtime.  Marland Kitchen ezetimibe (ZETIA) 10 MG tablet Take 10 mg by mouth every evening.  . furosemide (LASIX) 20 MG tablet Take 20 mg by mouth 3 (three) times a week.  . Meth-Hyo-M Bl-Na Phos-Ph Sal (URO-MP) 118 MG CAPS Take by mouth.  . metoprolol succinate (TOPROL-XL) 25 MG 24 hr tablet Take 25 mg by mouth daily.  . Multiple Vitamin (MULTIVITAMIN WITH MINERALS) TABS tablet Take 1 tablet by mouth daily.  Marland Kitchen nystatin cream (MYCOSTATIN) Apply 1 application topically 2 (two) times daily as needed (irritation).  Marland Kitchen omeprazole (PRILOSEC) 20 MG capsule Take 20 mg by mouth daily before breakfast.   . Potassium 99 MG TABS Take 99 mg by mouth daily.  . pravastatin (PRAVACHOL) 20 MG tablet Take 40 mg by mouth every Monday, Wednesday, and Friday. At night  . Probiotic Product (PROBIOTIC  PO) Take 1 capsule by mouth daily.  Marland Kitchen tiZANidine (ZANAFLEX) 2 MG tablet Take 2 mg by mouth at bedtime as needed for muscle spasms.   . Wheat Dextrin (BENEFIBER DRINK MIX) PACK Take 1 Package by mouth every other day as needed (constipation.).      Allergies:   Claritin-d 12 hour [loratadine-pseudoephedrine er], Codeine, Lisinopril, and Statins   Social History   Socioeconomic History  . Marital status: Widowed    Spouse name: Not on file  . Number of children: Not on file  . Years of education: Not on file  . Highest education level: Not on file  Occupational History  . Not on file  Tobacco Use  . Smoking status: Never Smoker  . Smokeless tobacco: Never Used  Vaping Use  . Vaping Use: Never used  Substance and Sexual Activity  . Alcohol use: No  . Drug use: No  . Sexual activity: Not on file  Other Topics Concern  . Not on file  Social History Narrative  . Not on file   Social Determinants of Health   Financial Resource Strain:  Not on file  Food Insecurity: Not on file  Transportation Needs: Not on file  Physical Activity: Not on file  Stress: Not on file  Social Connections: Not on file     Family History: The patient's family history includes AAA (abdominal aortic aneurysm) in her brother; Congestive Heart Failure in her father; Heart attack in her mother; Heart disease in her sister; Parkinson's disease in her sister. ROS:   Please see the history of present illness.    All other systems reviewed and are negative.  EKGs/Labs/Other Studies Reviewed:    The following studies were reviewed today:   Recent Labs: 12/25/2019: NT-Pro BNP 1,033 05/27/2020: BUN 27; Creatinine, Ser 1.19; Hemoglobin 12.9; Platelets 209; Potassium 4.6; Sodium 142  Recent Lipid Panel 07/02/2020: Cholesterol 183 LDL 97 triglycerides 145 HDL 45  Physical Exam:    VS:  BP (!) 126/56   Pulse 88   Ht 4\' 11"  (1.499 m)   Wt 153 lb 6.4 oz (69.6 kg)   SpO2 96%   BMI 30.98 kg/m     Wt Readings from Last 3 Encounters:  12/22/20 153 lb 6.4 oz (69.6 kg)  06/23/20 150 lb 12.8 oz (68.4 kg)  05/27/20 155 lb (70.3 kg)     GEN: Looks younger than her age well nourished, well developed in no acute distress HEENT: Normal NECK: No JVD; No carotid bruits LYMPHATICS: No lymphadenopathy CARDIAC: RRR, no murmurs, rubs, gallops RESPIRATORY:  Clear to auscultation without rales, wheezing or rhonchi  ABDOMEN: Soft, non-tender, non-distended MUSCULOSKELETAL:  No edema; No deformity  SKIN: Warm and dry NEUROLOGIC:  Alert and oriented x 3 PSYCHIATRIC:  Normal affect    Signed, Hannah More, MD  12/22/2020 11:21 AM    [place]

## 2020-12-22 ENCOUNTER — Other Ambulatory Visit: Payer: Self-pay

## 2020-12-22 ENCOUNTER — Ambulatory Visit: Payer: Medicare Other | Admitting: Cardiology

## 2020-12-22 ENCOUNTER — Encounter: Payer: Self-pay | Admitting: Cardiology

## 2020-12-22 VITALS — BP 126/56 | HR 88 | Ht 59.0 in | Wt 153.4 lb

## 2020-12-22 DIAGNOSIS — I110 Hypertensive heart disease with heart failure: Secondary | ICD-10-CM | POA: Diagnosis not present

## 2020-12-22 DIAGNOSIS — I342 Nonrheumatic mitral (valve) stenosis: Secondary | ICD-10-CM | POA: Diagnosis not present

## 2020-12-22 DIAGNOSIS — N1831 Chronic kidney disease, stage 3a: Secondary | ICD-10-CM

## 2020-12-22 DIAGNOSIS — E7800 Pure hypercholesterolemia, unspecified: Secondary | ICD-10-CM | POA: Diagnosis not present

## 2020-12-22 DIAGNOSIS — Z952 Presence of prosthetic heart valve: Secondary | ICD-10-CM

## 2020-12-22 DIAGNOSIS — I5032 Chronic diastolic (congestive) heart failure: Secondary | ICD-10-CM | POA: Diagnosis not present

## 2020-12-22 NOTE — Patient Instructions (Signed)

## 2020-12-23 ENCOUNTER — Other Ambulatory Visit: Payer: Self-pay | Admitting: Cardiology

## 2020-12-25 DIAGNOSIS — N644 Mastodynia: Secondary | ICD-10-CM | POA: Diagnosis not present

## 2020-12-26 ENCOUNTER — Telehealth: Payer: Self-pay | Admitting: Cardiology

## 2020-12-26 MED ORDER — FUROSEMIDE 20 MG PO TABS
20.0000 mg | ORAL_TABLET | ORAL | 3 refills | Status: DC
Start: 2020-12-26 — End: 2021-03-10

## 2020-12-26 NOTE — Telephone Encounter (Signed)
*  STAT* If patient is at the pharmacy, call can be transferred to refill team.   1. Which medications need to be refilled? (please list name of each medication and dose if known) furosemide (LASIX) 20 MG tablet  2. Which pharmacy/location (including street and city if local pharmacy) is medication to be sent to? [place], [place]  3. Do they need a 30 day or 90 day supply? [place]

## 2020-12-31 DIAGNOSIS — Z2821 Immunization not carried out because of patient refusal: Secondary | ICD-10-CM | POA: Diagnosis not present

## 2020-12-31 DIAGNOSIS — K862 Cyst of pancreas: Secondary | ICD-10-CM | POA: Diagnosis not present

## 2020-12-31 DIAGNOSIS — R000 Tachycardia, unspecified: Secondary | ICD-10-CM | POA: Diagnosis not present

## 2020-12-31 DIAGNOSIS — N183 Chronic kidney disease, stage 3 unspecified: Secondary | ICD-10-CM | POA: Diagnosis not present

## 2020-12-31 DIAGNOSIS — R911 Solitary pulmonary nodule: Secondary | ICD-10-CM | POA: Diagnosis not present

## 2020-12-31 DIAGNOSIS — K219 Gastro-esophageal reflux disease without esophagitis: Secondary | ICD-10-CM | POA: Diagnosis not present

## 2020-12-31 DIAGNOSIS — N390 Urinary tract infection, site not specified: Secondary | ICD-10-CM | POA: Diagnosis not present

## 2020-12-31 DIAGNOSIS — E559 Vitamin D deficiency, unspecified: Secondary | ICD-10-CM | POA: Diagnosis not present

## 2020-12-31 DIAGNOSIS — E785 Hyperlipidemia, unspecified: Secondary | ICD-10-CM | POA: Diagnosis not present

## 2020-12-31 DIAGNOSIS — R7303 Prediabetes: Secondary | ICD-10-CM | POA: Diagnosis not present

## 2020-12-31 DIAGNOSIS — Z952 Presence of prosthetic heart valve: Secondary | ICD-10-CM | POA: Diagnosis not present

## 2020-12-31 DIAGNOSIS — Z85048 Personal history of other malignant neoplasm of rectum, rectosigmoid junction, and anus: Secondary | ICD-10-CM | POA: Diagnosis not present

## 2020-12-31 DIAGNOSIS — I129 Hypertensive chronic kidney disease with stage 1 through stage 4 chronic kidney disease, or unspecified chronic kidney disease: Secondary | ICD-10-CM | POA: Diagnosis not present

## 2021-01-14 DIAGNOSIS — N644 Mastodynia: Secondary | ICD-10-CM | POA: Diagnosis not present

## 2021-01-14 DIAGNOSIS — N6452 Nipple discharge: Secondary | ICD-10-CM | POA: Diagnosis not present

## 2021-01-14 DIAGNOSIS — R922 Inconclusive mammogram: Secondary | ICD-10-CM | POA: Diagnosis not present

## 2021-01-15 DIAGNOSIS — N2581 Secondary hyperparathyroidism of renal origin: Secondary | ICD-10-CM | POA: Diagnosis not present

## 2021-01-15 DIAGNOSIS — D631 Anemia in chronic kidney disease: Secondary | ICD-10-CM | POA: Diagnosis not present

## 2021-01-15 DIAGNOSIS — E785 Hyperlipidemia, unspecified: Secondary | ICD-10-CM | POA: Diagnosis not present

## 2021-01-15 DIAGNOSIS — N1831 Chronic kidney disease, stage 3a: Secondary | ICD-10-CM | POA: Diagnosis not present

## 2021-01-15 DIAGNOSIS — Z952 Presence of prosthetic heart valve: Secondary | ICD-10-CM | POA: Diagnosis not present

## 2021-01-15 DIAGNOSIS — I129 Hypertensive chronic kidney disease with stage 1 through stage 4 chronic kidney disease, or unspecified chronic kidney disease: Secondary | ICD-10-CM | POA: Diagnosis not present

## 2021-01-15 DIAGNOSIS — N398 Other specified disorders of urinary system: Secondary | ICD-10-CM | POA: Diagnosis not present

## 2021-01-26 DIAGNOSIS — N644 Mastodynia: Secondary | ICD-10-CM | POA: Diagnosis not present

## 2021-01-26 DIAGNOSIS — D242 Benign neoplasm of left breast: Secondary | ICD-10-CM | POA: Diagnosis not present

## 2021-01-26 DIAGNOSIS — R928 Other abnormal and inconclusive findings on diagnostic imaging of breast: Secondary | ICD-10-CM | POA: Diagnosis not present

## 2021-02-04 DIAGNOSIS — D369 Benign neoplasm, unspecified site: Secondary | ICD-10-CM | POA: Diagnosis not present

## 2021-02-23 ENCOUNTER — Telehealth: Payer: Self-pay | Admitting: Oncology

## 2021-02-23 NOTE — Telephone Encounter (Signed)
Per 03/20/20 Office Note, patient scheduled for 9/22 Labs, Follow Up

## 2021-03-10 ENCOUNTER — Telehealth: Payer: Self-pay | Admitting: Cardiology

## 2021-03-10 MED ORDER — FUROSEMIDE 20 MG PO TABS: 20.0000 mg | ORAL_TABLET | ORAL | 0 refills | Status: AC

## 2021-03-10 NOTE — Telephone Encounter (Signed)
*  STAT* If patient is at the pharmacy, call can be transferred to refill team.   1. Which medications need to be refilled? (please list name of each medication and dose if known) furosemide (LASIX) 20 MG tablet  2. Which pharmacy/location (including street and city if local pharmacy) is medication to be sent to? [place], [place] [address]  3. Do they need a 30 day or 90 day supply? 30  Patient is out of town at [employer] and only have one pill left.

## 2021-03-10 NOTE — Telephone Encounter (Signed)
Refill of Lasix 20 mg sent to [facility].

## 2021-03-12 DIAGNOSIS — R6521 Severe sepsis with septic shock: Secondary | ICD-10-CM | POA: Diagnosis not present

## 2021-03-12 DIAGNOSIS — D649 Anemia, unspecified: Secondary | ICD-10-CM | POA: Diagnosis not present

## 2021-03-12 DIAGNOSIS — A419 Sepsis, unspecified organism: Secondary | ICD-10-CM | POA: Diagnosis not present

## 2021-03-12 DIAGNOSIS — I5033 Acute on chronic diastolic (congestive) heart failure: Secondary | ICD-10-CM | POA: Diagnosis not present

## 2021-03-12 DIAGNOSIS — J9600 Acute respiratory failure, unspecified whether with hypoxia or hypercapnia: Secondary | ICD-10-CM | POA: Diagnosis not present

## 2021-03-12 DIAGNOSIS — I472 Ventricular tachycardia: Secondary | ICD-10-CM | POA: Diagnosis not present

## 2021-03-12 DIAGNOSIS — K5730 Diverticulosis of large intestine without perforation or abscess without bleeding: Secondary | ICD-10-CM | POA: Diagnosis not present

## 2021-03-12 DIAGNOSIS — N1831 Chronic kidney disease, stage 3a: Secondary | ICD-10-CM | POA: Diagnosis not present

## 2021-03-12 DIAGNOSIS — I700 Atherosclerosis of aorta: Secondary | ICD-10-CM | POA: Diagnosis not present

## 2021-03-12 DIAGNOSIS — E870 Hyperosmolality and hypernatremia: Secondary | ICD-10-CM | POA: Diagnosis not present

## 2021-03-12 DIAGNOSIS — R54 Age-related physical debility: Secondary | ICD-10-CM | POA: Diagnosis not present

## 2021-03-12 DIAGNOSIS — Z515 Encounter for palliative care: Secondary | ICD-10-CM | POA: Diagnosis not present

## 2021-03-12 DIAGNOSIS — R918 Other nonspecific abnormal finding of lung field: Secondary | ICD-10-CM | POA: Diagnosis not present

## 2021-03-12 DIAGNOSIS — E876 Hypokalemia: Secondary | ICD-10-CM | POA: Diagnosis not present

## 2021-03-12 DIAGNOSIS — N185 Chronic kidney disease, stage 5: Secondary | ICD-10-CM | POA: Diagnosis not present

## 2021-03-12 DIAGNOSIS — D638 Anemia in other chronic diseases classified elsewhere: Secondary | ICD-10-CM | POA: Diagnosis not present

## 2021-03-12 DIAGNOSIS — Z66 Do not resuscitate: Secondary | ICD-10-CM | POA: Diagnosis not present

## 2021-03-12 DIAGNOSIS — N179 Acute kidney failure, unspecified: Secondary | ICD-10-CM | POA: Diagnosis not present

## 2021-03-12 DIAGNOSIS — I480 Paroxysmal atrial fibrillation: Secondary | ICD-10-CM | POA: Diagnosis not present

## 2021-03-12 DIAGNOSIS — E8779 Other fluid overload: Secondary | ICD-10-CM | POA: Diagnosis not present

## 2021-03-12 DIAGNOSIS — Z888 Allergy status to other drugs, medicaments and biological substances status: Secondary | ICD-10-CM | POA: Diagnosis not present

## 2021-03-12 DIAGNOSIS — I2510 Atherosclerotic heart disease of native coronary artery without angina pectoris: Secondary | ICD-10-CM | POA: Diagnosis not present

## 2021-03-12 DIAGNOSIS — K449 Diaphragmatic hernia without obstruction or gangrene: Secondary | ICD-10-CM | POA: Diagnosis not present

## 2021-03-12 DIAGNOSIS — Z952 Presence of prosthetic heart valve: Secondary | ICD-10-CM | POA: Diagnosis not present

## 2021-03-12 DIAGNOSIS — E871 Hypo-osmolality and hyponatremia: Secondary | ICD-10-CM | POA: Diagnosis not present

## 2021-03-12 DIAGNOSIS — R0989 Other specified symptoms and signs involving the circulatory and respiratory systems: Secondary | ICD-10-CM | POA: Diagnosis not present

## 2021-03-12 DIAGNOSIS — Z28310 Unvaccinated for covid-19: Secondary | ICD-10-CM | POA: Diagnosis not present

## 2021-03-12 DIAGNOSIS — I5032 Chronic diastolic (congestive) heart failure: Secondary | ICD-10-CM | POA: Diagnosis not present

## 2021-03-12 DIAGNOSIS — J984 Other disorders of lung: Secondary | ICD-10-CM | POA: Diagnosis not present

## 2021-03-12 DIAGNOSIS — I130 Hypertensive heart and chronic kidney disease with heart failure and stage 1 through stage 4 chronic kidney disease, or unspecified chronic kidney disease: Secondary | ICD-10-CM | POA: Diagnosis not present

## 2021-03-12 DIAGNOSIS — Z885 Allergy status to narcotic agent status: Secondary | ICD-10-CM | POA: Diagnosis not present

## 2021-03-12 DIAGNOSIS — Z4682 Encounter for fitting and adjustment of non-vascular catheter: Secondary | ICD-10-CM | POA: Diagnosis not present

## 2021-03-12 DIAGNOSIS — Z20822 Contact with and (suspected) exposure to covid-19: Secondary | ICD-10-CM | POA: Diagnosis not present

## 2021-03-12 DIAGNOSIS — J90 Pleural effusion, not elsewhere classified: Secondary | ICD-10-CM | POA: Diagnosis not present

## 2021-03-12 DIAGNOSIS — R0602 Shortness of breath: Secondary | ICD-10-CM | POA: Diagnosis not present

## 2021-03-12 DIAGNOSIS — J9601 Acute respiratory failure with hypoxia: Secondary | ICD-10-CM | POA: Diagnosis not present

## 2021-03-12 DIAGNOSIS — R509 Fever, unspecified: Secondary | ICD-10-CM | POA: Diagnosis not present

## 2021-03-12 DIAGNOSIS — N170 Acute kidney failure with tubular necrosis: Secondary | ICD-10-CM | POA: Diagnosis not present

## 2021-03-13 DIAGNOSIS — Z4682 Encounter for fitting and adjustment of non-vascular catheter: Secondary | ICD-10-CM | POA: Diagnosis not present

## 2021-03-13 DIAGNOSIS — J9601 Acute respiratory failure with hypoxia: Secondary | ICD-10-CM | POA: Diagnosis not present

## 2021-03-13 DIAGNOSIS — J984 Other disorders of lung: Secondary | ICD-10-CM | POA: Diagnosis not present

## 2021-03-13 DIAGNOSIS — R918 Other nonspecific abnormal finding of lung field: Secondary | ICD-10-CM | POA: Diagnosis not present

## 2021-03-13 DIAGNOSIS — R0602 Shortness of breath: Secondary | ICD-10-CM | POA: Diagnosis not present

## 2021-03-13 DIAGNOSIS — J90 Pleural effusion, not elsewhere classified: Secondary | ICD-10-CM | POA: Diagnosis not present

## 2021-03-14 DIAGNOSIS — J9601 Acute respiratory failure with hypoxia: Secondary | ICD-10-CM | POA: Diagnosis not present

## 2021-03-14 DIAGNOSIS — R918 Other nonspecific abnormal finding of lung field: Secondary | ICD-10-CM | POA: Diagnosis not present

## 2021-03-15 DIAGNOSIS — J9601 Acute respiratory failure with hypoxia: Secondary | ICD-10-CM | POA: Diagnosis not present

## 2021-03-16 DIAGNOSIS — N179 Acute kidney failure, unspecified: Secondary | ICD-10-CM | POA: Diagnosis not present

## 2021-03-16 DIAGNOSIS — J9600 Acute respiratory failure, unspecified whether with hypoxia or hypercapnia: Secondary | ICD-10-CM | POA: Diagnosis not present

## 2021-03-16 DIAGNOSIS — Z952 Presence of prosthetic heart valve: Secondary | ICD-10-CM | POA: Diagnosis not present

## 2021-03-16 DIAGNOSIS — K449 Diaphragmatic hernia without obstruction or gangrene: Secondary | ICD-10-CM | POA: Diagnosis not present

## 2021-03-16 DIAGNOSIS — R6521 Severe sepsis with septic shock: Secondary | ICD-10-CM | POA: Diagnosis not present

## 2021-03-16 DIAGNOSIS — A419 Sepsis, unspecified organism: Secondary | ICD-10-CM | POA: Diagnosis not present

## 2021-03-16 DIAGNOSIS — N185 Chronic kidney disease, stage 5: Secondary | ICD-10-CM | POA: Diagnosis not present

## 2021-03-16 DIAGNOSIS — I5032 Chronic diastolic (congestive) heart failure: Secondary | ICD-10-CM | POA: Diagnosis not present

## 2021-03-16 DIAGNOSIS — J9601 Acute respiratory failure with hypoxia: Secondary | ICD-10-CM | POA: Diagnosis not present

## 2021-03-16 DIAGNOSIS — N1831 Chronic kidney disease, stage 3a: Secondary | ICD-10-CM | POA: Diagnosis not present

## 2021-03-16 DIAGNOSIS — E871 Hypo-osmolality and hyponatremia: Secondary | ICD-10-CM | POA: Diagnosis not present

## 2021-03-17 DIAGNOSIS — J9600 Acute respiratory failure, unspecified whether with hypoxia or hypercapnia: Secondary | ICD-10-CM | POA: Diagnosis not present

## 2021-03-17 DIAGNOSIS — E871 Hypo-osmolality and hyponatremia: Secondary | ICD-10-CM | POA: Diagnosis not present

## 2021-03-17 DIAGNOSIS — J9601 Acute respiratory failure with hypoxia: Secondary | ICD-10-CM | POA: Diagnosis not present

## 2021-03-17 DIAGNOSIS — K449 Diaphragmatic hernia without obstruction or gangrene: Secondary | ICD-10-CM | POA: Diagnosis not present

## 2021-03-17 DIAGNOSIS — D649 Anemia, unspecified: Secondary | ICD-10-CM | POA: Diagnosis not present

## 2021-03-17 DIAGNOSIS — N179 Acute kidney failure, unspecified: Secondary | ICD-10-CM | POA: Diagnosis not present

## 2021-03-17 DIAGNOSIS — Z952 Presence of prosthetic heart valve: Secondary | ICD-10-CM | POA: Diagnosis not present

## 2021-03-17 DIAGNOSIS — I5032 Chronic diastolic (congestive) heart failure: Secondary | ICD-10-CM | POA: Diagnosis not present

## 2021-03-18 DIAGNOSIS — J9600 Acute respiratory failure, unspecified whether with hypoxia or hypercapnia: Secondary | ICD-10-CM | POA: Diagnosis not present

## 2021-03-18 DIAGNOSIS — D649 Anemia, unspecified: Secondary | ICD-10-CM | POA: Diagnosis not present

## 2021-03-18 DIAGNOSIS — N179 Acute kidney failure, unspecified: Secondary | ICD-10-CM | POA: Diagnosis not present

## 2021-03-19 DIAGNOSIS — J9600 Acute respiratory failure, unspecified whether with hypoxia or hypercapnia: Secondary | ICD-10-CM | POA: Diagnosis not present

## 2021-03-19 DIAGNOSIS — N179 Acute kidney failure, unspecified: Secondary | ICD-10-CM | POA: Diagnosis not present

## 2021-04-09 ENCOUNTER — Ambulatory Visit: Payer: Medicare Other | Admitting: Oncology

## 2021-04-09 ENCOUNTER — Other Ambulatory Visit: Payer: Medicare Other

## 2021-04-18 DEATH — deceased

## 2021-06-25 IMAGING — CT CT ANGIO CHEST
1 of 6 series · 1 of 28 positions shown · IV contrast (omnipaque)
Comparison: Chest CT 04/04/2018.

CLINICAL DATA: 87-year-old female under preprocedural evaluation
prior to potential transcatheter aortic valve replacement (TAVR)
procedure.

EXAM:
CT ANGIOGRAPHY CHEST, ABDOMEN AND PELVIS
TECHNIQUE: Multidetector CT imaging through the chest, abdomen and pelvis was
performed using the standard protocol during bolus administration of
intravenous contrast. Multiplanar reconstructed images and MIPs were
obtained and reviewed to evaluate the vascular anatomy.
CONTRAST:  100mL OMNIPAQUE IOHEXOL 350 MG/ML SOLN

[Series 422: — · 0.15mm/px · 1 of 8 slices shown]
[im 4/8]
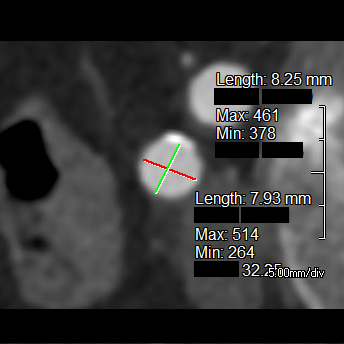

[1 of 28 positions shown; findings below may reference images not displayed]

FINDINGS: CTA CHEST FINDINGS

Cardiovascular: Heart size is mildly enlarged. There is no
significant pericardial fluid, thickening or pericardial
calcification. There is aortic atherosclerosis, as well as
atherosclerosis of the great vessels of the mediastinum and the
coronary arteries, including calcified atherosclerotic plaque in the
left main, left anterior descending and right coronary arteries.
Thickening calcification of the aortic valve. Severe calcifications
of the mitral annulus and mitral valve.

Mediastinum/Lymph Nodes: No pathologically enlarged mediastinal or
hilar lymph nodes. Moderate hiatal hernia. No axillary
lymphadenopathy.

Lungs/Pleura: Small pulmonary nodules are noted in the lungs,
largest of which is in the left upper lobe (axial image 28 of series
8), measuring 6 x 6 mm. In addition, there is a ground-glass
attenuation 1.6 x 1.0 cm lesion in the left upper lobe (axial image
34 of series 8). Patchy areas of ground-glass attenuation and
interlobular septal thickening noted throughout the lungs
bilaterally. No acute consolidative airspace disease. No pleural
effusions.

Musculoskeletal/Soft Tissues: There are no aggressive appearing
lytic or blastic lesions noted in the visualized portions of the
skeleton.

CTA ABDOMEN AND PELVIS FINDINGS

Hepatobiliary: No suspicious cystic or solid hepatic lesions. No
intra or extrahepatic biliary ductal dilatation. Status post
cholecystectomy.

Pancreas: In the head of the pancreas (axial image 114 of series 7
and coronal image 66 of series 9) there is a 1.3 x 0.9 x 1.2 cm
low-attenuation lesion in close proximity to the common bile duct.
No other pancreatic mass. No pancreatic ductal dilatation. No
peripancreatic fluid collections or inflammatory changes.

Spleen: Unremarkable.

Adrenals/Urinary Tract: Bilateral kidneys and bilateral adrenal
glands are normal in appearance. No hydroureteronephrosis. Small
amount of gas non dependently in the urinary bladder. Otherwise,
urinary bladder is normal in appearance.

Stomach/Bowel: Normal appearance of the stomach. No pathologic
dilatation of small bowel or colon. Numerous colonic diverticulae
are noted, particularly in the sigmoid colon, without surrounding
inflammatory changes to suggest an acute diverticulitis at this
time. The appendix is not confidently identified and may be
surgically absent. Regardless, there are no inflammatory changes
noted adjacent to the cecum to suggest the presence of an acute
appendicitis at this time.

Vascular/Lymphatic: Aortic atherosclerosis, with vascular findings
and measurements pertinent to potential TAVR procedure, as detailed
below. No aneurysm or dissection noted in the abdominal or pelvic
vasculature. No lymphadenopathy noted in the abdomen or pelvis.

Reproductive: Status post hysterectomy. Ovaries are not confidently
identified may be surgically absent or trophic.

Other: No significant volume of ascites.  No pneumoperitoneum.

Musculoskeletal: There are no aggressive appearing lytic or blastic
lesions noted in the visualized portions of the skeleton.

VASCULAR MEASUREMENTS PERTINENT TO TAVR:

AORTA:

Minimal Aortic Iiameter-IR x 10 mm

Severity of Aortic Calcification-moderate to severe

RIGHT PELVIS:

Right Common Iliac Artery -

Minimal Xiameter-1.L x 7.9 mm

Tortuosity-mild

Calcification-mild

Right External Iliac Artery -

Minimal Wiameter-N.G x 6.6 mm

Tortuosity-mild-to-moderate

Calcification-none

Right Common Femoral Artery -

Minimal Jiameter-V.V x 6.7 mm

Tortuosity-mild

Calcification-mild

LEFT PELVIS:

Left Common Iliac Artery -

Minimal Fiameter-J.X x 8.6 mm

Tortuosity-mild

Calcification-mild

Left External Iliac Artery -

Minimal 6iameter-S.8 x 6.1 mm

Tortuosity-mild-to-moderate

Calcification-none

Left Common Femoral Artery -

Minimal Aiameter-J.9 x 6.9 mm

Tortuosity-mild

Calcification-mild

Review of the MIP images confirms the above findings.
IMPRESSION: 1. Vascular findings and measurements pertinent to potential TAVR
procedure, as detailed above.
2. Thickening calcification of the aortic valve, compatible with the
reported clinical history of severe aortic stenosis.
3. Severe calcifications also noted of the mitral annulus and mitral
valve.
4. Aortic atherosclerosis, in addition to left main and 2 vessel
coronary artery disease.
5. Small pulmonary nodules in the lungs, largest of which is a
ground-glass attenuation nodule in the left upper lobe (1.6 x
cm). Initial follow-up with CT at 6-12 months is recommended to
confirm persistence. If persistent, repeat CT is recommended every 2
years until 5 years of stability has been established. This
recommendation follows the consensus statement: Guidelines for
Management of Incidental Pulmonary Nodules Detected on CT Images:
6. The appearance of the lungs suggests mild interstitial pulmonary
edema. Mild cardiomegaly. Imaging findings suggestive of mild
congestive heart failure.
7. 1.3 x 0.9 x 1.2 cm low-attenuation lesion in the head of the
pancreas. This is nonspecific, and may simply represent a tiny
pancreatic pseudocyst. However, attention on follow-up studies is
recommended. Follow-up abdominal MRI with and without IV gadolinium
with MRCP or pancreatic protocol CT scan is recommended in 2 months
to ensure the stability of this finding. This recommendation follows
ACR consensus guidelines: Management of Incidental Pancreatic Cysts:
A White Paper of the ACR Incidental Findings Committee. [HOSPITAL] 9222;[DATE].
8. Severe colonic diverticulosis without evidence of acute
diverticulitis at this time.
9. Additional incidental findings, as above.
